# Patient Record
Sex: Male | Born: 1990 | Race: Black or African American | Hispanic: No | Marital: Single | Smoking: Current every day smoker
Health system: Southern US, Community
[De-identification: ages and names within clinical notes are randomized; demographics above are authoritative.]

## PROBLEM LIST (undated history)

## (undated) ENCOUNTER — Ambulatory Visit (HOSPITAL_COMMUNITY): Admission: EM | Payer: Self-pay | Source: Home / Self Care

## (undated) ENCOUNTER — Ambulatory Visit (HOSPITAL_COMMUNITY): Admission: EM | Payer: Self-pay

## (undated) ENCOUNTER — Ambulatory Visit (HOSPITAL_COMMUNITY): Payer: Self-pay | Source: Home / Self Care

## (undated) DIAGNOSIS — E119 Type 2 diabetes mellitus without complications: Secondary | ICD-10-CM

## (undated) DIAGNOSIS — I639 Cerebral infarction, unspecified: Secondary | ICD-10-CM

## (undated) DIAGNOSIS — Q249 Congenital malformation of heart, unspecified: Secondary | ICD-10-CM

## (undated) HISTORY — PX: CARDIAC SURGERY: SHX584

---
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
## 2010-06-05 ENCOUNTER — Emergency Department (HOSPITAL_COMMUNITY): Admission: EM | Admit: 2010-06-05 | Discharge: 2010-06-06 | Payer: Self-pay | Admitting: Emergency Medicine

## 2011-02-21 LAB — RAPID STREP SCREEN (MED CTR MEBANE ONLY): Streptococcus, Group A Screen (Direct): NEGATIVE

## 2011-04-20 NOTE — Discharge Summary (Signed)
NAME:  [name] [name], [name] [name]                  ACCOUNT NO.:  [id number]   MEDICAL RECORD NO.:  [id number]          PATIENT TYPE:  INP   LOCATION:  6121                         FACILITY:  [facility]   PHYSICIAN:  Leighton Roach McDiarmid, M.D.DATE OF BIRTH:  [birth year]   DATE OF ADMISSION:  03/31/2008  DATE OF DISCHARGE:  04/03/2008                               DISCHARGE SUMMARY   REASON FOR ADMISSION:  Right arm weakness and numbness.   DISCHARGE DIAGNOSES:  1. 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  2. Atrial Septal Defect, ostium secondum type  3. Severe attention deficit hyperactivity disorder.   DISCHARGE MEDICATIONS:  1. Aspirin 81 mg p.o. q. day.  2. Clonidine 0.1 mg p.o. nightly.  3. Daytrana 20 mg patch per 9 hours daily.   CONSULTS:  1. Pediatric cardiology:  Dr. Ace Gins of [facility].  2. [place] [facility]:  Dr. Pearlean Brownie and Dr. Sharene Skeans.   PROCEDURES AND STUDIES:  The patient had a CT of the head on March 31, 2008, which revealed no acute intracranial findings demonstrated and no  CT evidence of acute stroke.  An  MRI of the brain and MRA of the brain  on April 01, 2008, revealed an acute subacute infarct involving the  posterior frontal and anterior left parietal cortex on the either side  of the central sulcus and an additional punctate focus of infarction  involving the posterior aspect of the singular gyrus on the left,  minimal sinus disease and cerebellar tonsillar ectopia without Chiari  malformation.  MRA revealed a normal circle of Willis.  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  Carotid Dopplers  revealed 40% to 59% stenosis in the right and left proximal internal  carotid arteries.  The plaque morphology does not support the findings.  The higher velocities may be due to the patient's age.  Vertebral artery  with antegrade flow bilaterally.  Preliminary report on peripheral  Dopplers revealed no evidence of DVT.  Transcranial Dopplers were still  pending at the time of discharge.  EEG on April 01, 2008, revealed a  normal record with the patient awake and asleep.   LABORATORY DATA:  Stroke panel a revealed white blood cell count of 7.1,  hemoglobin 13.4, hematocrit 40.0, MCV 88.6, RDW 13.8, platelet count 222  with a normal differential.  INR of 1.2.  Sodium 140, potassium 3.5,  chloride 109, bicarbonate 25, glucose 91, BUN 9, creatinine 0.72, total  bilirubin 0.4, alkaline phosphatase 212, AST 29, ALT 22, total protein  6.7, albumin 4.0, and calcium 9.0.  CK 462, CK-MB 3.4, troponin-I of  0.01.  Urine  drug screen was negative.  Urinalysis revealed a specific  gravity of 1.030, otherwise was negative.  Lipid profile revealed  cholesterol of 109, triglycerides 72, HDL 37, LDL 58, VLDL 14.  Erythrocyte sedimentation rate was 3.  C3 complement level was 115.  C4  complement level was 22.  Hemoglobin A1c was 5.3.  Homocysteine level  was 5.9.  Hemoglobin electrophoresis revealed 97.1% hemoglobin A and  2.9% hemoglobin A2.  Hypercoagulable panel revealed antithrombin III  enzymatic of 107 which is normal, Protein C total 59, protein C  functional 91, protein S total 91, protein S functional 81, PTT-LA 43.2,  dRVVT 46.7.  Lupus anticoagulant was not detected; it was negative for  prothrombin II gene mutation.  ANA was negative.  There are still more  laboratory studies pending at the time of discharge.   HOSPITAL COURSE:  [name] [name] is a 20 year old African American male who  initially was admitted  complaining of right-handed weakness and numbness  and a right facial weakness and numbness.  Head CT initially did not  show any signs or symptoms of infarct, but the patient continued to  decline of symptoms.  Therefore, a full workup was completed.  MRI of  the brain did in fact show a left-sided infarct which would correlate  with his symptoms as he described.  Hypercoagulability workup was  initiated to try to evaluate reason for the patient to have infarcts.  At the time of this dictation, there was nothing found except for the  secundum ASD as noted above.  The patient did have a few laboratory data  values still pending.  The patient's symptoms improved during his  hospital stay, and he was with full function of the right hand but with  still a minimal amount of clumsiness and some decreased sensation of the  right hand.  PT and OT evaluated the patient during hospital stay and  felt that he was certainly stable for discharge.  He was, however,  provided with a prescription for PT/OT as an outpatient to help with  coordination of his right hand after the stroke.   INSTRUCTIONS TO FOLLOW UP:  The patient is to return to his group home  after discharge.  He has no restriction with regard to his activity.  He  had an appointment scheduled with Dr. Ace Gins of pediatric cardiology,  phone number [phone] on Apr 18, 2008, at 10 a.m.; with Dr. Deirdre Priest of  the [facility], phone number [phone] on Apr 15, 2008, at 4:25 p.m.; with Dr. Pearlean Brownie of [facility], phone number  [phone] on June 03, 2008, at 12 o'clock p.m.  He was instructed to stop  smoking.  It will need to be followed up because the patient can be  fully heparinized for the procedure to close his secundum ASD.  We need  to know if neurology is okay with this.  I have asked Dr. Deirdre Priest to  look into this as I was also unable to contact Dr. Pearlean Brownie prior to the  patient's discharge.       Ancil Boozer, MD  Electronically Signed      Leighton Roach McDiarmid, M.D.  Electronically Signed    SA/MEDQ  D:  04/03/2008  T:  04/04/2008  Job:  [id number]   cc:   Jhonnie Garner. Ace Gins, MD  Pearlean Brownie, M.D.  Pramod P. Pearlean Brownie, MD

## 2011-04-20 NOTE — Procedures (Signed)
EEG:  [id number].   CLINICAL HISTORY:  The patient is a 20 year old with weakness and  aphasia and with history of attention deficit disorder.  He had sudden  onset of right upper extremity weakness, numbness and aphasia, loss of  awareness.  He had been outside running, came in, sat down, was unable  to move or feel his right arm, and felt as if his face is drooping.  The  patient stared.  There was no loss of continence.  Some witnesses  thought that might be seizure activity.  The study is being done to look  for the presence of seizures (780.02).   PROCEDURE:  The tracing was carried out on a 32-channel digital Cadwell  recorder, reformatted into 16-channel montages with one devoted to EKG.  The patient was awake.  Medications include aspirin, clonidine, and  Daytrana.  International October 20 system of lead placement was used.   DESCRIPTION OF FINDINGS:  Dominant frequency is a 9 Hz, 30-40 mcV, alpha  range activity is well modulated and regulated and attenuates partially  with eye opening.   Background activity consists of mixed frequency, theta range activity of  20 mcV.  The patient drifts into natural sleep with vertex sharp waves  and symmetric and synchronous sleep spindles.  There was no focal  slowing.  There was no interictal epileptiform activity in the form of  spikes or sharp waves.  Activating procedures with hyperventilation  caused arousal.  Photic stimulation induced a sustained symmetric  driving response at multiple frequencies.   EKG showed regular sinus rhythm with ventricular response of 60 beats  per minute.   IMPRESSION:  Normal record with the patient awake and asleep.      Deanna Artis. Sharene Skeans, M.D.  Electronically Signed     NFA:OZHY  D:  04/01/2008 18:24:25  T:  04/02/2008 06:31:28  Job #:  [id number]   cc:   Leighton Roach McDiarmid, M.D.

## 2011-04-20 NOTE — Consult Note (Signed)
NAME:  [name], [name]                  ACCOUNT NO.:  [id number]   MEDICAL RECORD NO.:  [id number]          PATIENT TYPE:  OBV   LOCATION:  6121                         FACILITY:  [facility]   PHYSICIAN:  Pramod P. Pearlean Brownie, MD    DATE OF BIRTH:  [birth year]   DATE OF CONSULTATION:  DATE OF DISCHARGE:                                 CONSULTATION   REFERRING PHYSICIAN:  Lanna Poche, MD   REASON FOR REFERRAL:  Code stroke.   HISTORY OF PRESENT ILLNESS:  [name] [name] is a 20 year old African American  male who was playing cards this evening at his group home where he was  found to be staring and unresponsive with eyes open and then he lead to  the right and fell down.  He did not loose consciousness, but  subsequently complained of right-sided weakness.  He was able to give a  history to EMS of right-sided numbness.  He denies any headache.  There  was no witnessed seizure activity.  There was some facial droop and  drooling of saliva from the right side as per an eye witness whom I  spoke to.  He did not have any tonic-clonic activity, loss of  consciousness, tongue bite, or incontinence.  The patient has been doing  well since then, but is complaining of some persistent right-sided  sensory loss and some mild subjective right hand weakness.  He has no  known prior history of stroke, TIA, seizures, syncope, or loss of  consciousness.  No childhood history of febrile seizures or epilepsy.  No family history of strokes or MI at a young age.   PAST MEDICAL HISTORY:  Significant for attention-deficit hyperactivity  disorder.  He has been noncompliant with his medications.  He is in  trouble with the law and is hence in a group home.   SOCIAL HISTORY:  He is smoking.  He had a history of drug abuse in the  past.  Currently, he denies it and he states he does not drink alcohol  either.   HOME MEDICATIONS:  Clonidine and Daytrana.   REVIEW OF SYSTEMS:  Not significant for any recent fever, cough,  chest  pain, diarrhea, or illness.   PHYSICAL EXAM:  GENERAL:  Reveals young African American boy, who is not  in distress.  He is afebrile.  VITAL SIGNS:  Temperature 98.2, pulse rate is 63 per minute and sinus,  blood pressure 113/60, respiratory rate 18 per minute, and sats 98% on  room air.  HEAD:  Nontraumatic.  NECK:  Supple.  There is no bruit.  ENT:  Unremarkable.  CARDIAC:  Regular heart sounds.  LUNGS:  Clear to auscultation.  NEUROLOGICAL:  The patient is awake and alert.  He is oriented to time,  place, and person.  There is no aphagia plus dysarthria.  Movements are  full range with no nystagmus.  Face is symmetric.  Tongue is midline.  Motor system exam reveals no upper extremity drift.  He has mild  subjective weakness in the right grip, but good strength at the elbow  and  shoulders.  Fine finger movements appear to be diminished on the  right.  He does orbit the left over right upper extremity.  Effort is  quite poor.  Finger-to-nose coordination is accurate on the left and he  misses on the right.  There is no lower extremity weakness, drift,  sensory loss, or incoordination.  He splits the midline for touch and  pinprick and complains of right hemibody sensory loss including the  forehead.  He also splits the midline for vibration sensation.  His  vibration sensation is diminished in the right upper extremity, but not  in the lower extremity.   Data revealed, CT scan of  the head noncontrast study, film personally  reviewed, and is unremarkable without acute abnormality.   ADMISSION LABS:  All unremarkable except slightly elevated CK, but the  patient was playing basketball today.  Urine drug screen is pending.   IMPRESSION:  A 20 year old boy with a brief episode of unresponsiveness  followed by some subjective right-sided weakness and sensory loss with  the neurological exam, which has some nonorganic features.  I doubt this  represents a stroke.  Seizure with  possible some postictal weakness is a  possibility.   PLAN:  The patient should be admitted for 23-hour observation, check MRI  scan of the brain as well as an EEG.  Hold off  any definitive treatment  at the present time.  I had a long discussion with the patient's mother  and grandmother regarding his presentation and discussed the reasons for  not giving thrombolysis with TPA because of his minimum symptoms and the  risk of hemorrhage outweighs benefit in his case plus his exam is also  inconsistent with some nonorganic features.  We could check urine drug  screen as well as sickle cell screen.  Call for questions.   Thank you for this referral.           ______________________________  Sunny Schlein. Pearlean Brownie, MD     PPS/MEDQ  D:  03/31/2008  T:  04/01/2008  Job:  [id number]

## 2011-04-20 NOTE — H&P (Signed)
NAME:  [name] [name], [name] [name]                  ACCOUNT NO.:  [id number]   MEDICAL RECORD NO.:  [id number]          PATIENT TYPE:  OBV   LOCATION:  6121                         FACILITY:  [facility]   PHYSICIAN:  Leighton Roach McDiarmid, M.D.DATE OF BIRTH:  December 27, 1990   DATE OF ADMISSION:  03/31/2008  DATE OF DISCHARGE:                              HISTORY & PHYSICAL   CHIEF COMPLAINT:  Weakness and aphasia.   HISTORY OF PRESENT ILLNESS:  This 20 year old now resident of group home  for behavioral issues with past medical history of ADHD, presents to ED  after brief episode of sudden onset of right upper extremity weakness,  numbness, aphagia, and loss of awareness.  He reports that he was  outside running and playing in heat and felt fine.  Came inside, sat  down on a beanbag chair.  When he went to stand up, he noticed that he  could not move or feel his right arm, and he is unable to speak and felt  like his face is drooping.  When this is reported, he did not respond to  questioning and simply stared.  No loss of conscious, shaking activity,  loss of bladder and bowel incontinence.  Code status was called upon his  arrival to the ED.  Within 45 minutes to beginning of episode, he  regained full functioning except for some remaining tingling in his  right upper extremity.  Head CT negative for acute stroke.  The patient  denies any recent headache, photophobia, fevers, or other systemic  symptoms.  He does admit to on and off lower back pain that has been  ongoing for a week.  Denies dysuria or hematuria.  He denies any drug or  EtOH use.  No known family history of sickle cell disease or sickle  trait, although father's side family is unknown.   ALLERGIES:  No known drug allergies.   PAST MEDICAL HISTORY:  Completing one year program at a group home due  to drug use and behavioral issues.  Per his grandmother, he is doing  well.  Also, he has a history of ADHD.   FAMILY HISTORY:  No known history of  sickle cell.   SOCIAL HISTORY:  Currently lives at group home due to behavioral issues.  Grandmother very involved.  He denies any current drug, tobacco, or EtOH  use.   PHYSICAL EXAM:  VITAL SIGNS:  Temperature is 98.2, pulse is 63,  respiratory rate is 18, BP is 113/68, and O2 sat is 100% on room air.  GENERAL:  He is a well-appearing, alert adolescent, in no acute  distress.  LUNGS:  Clear to auscultation.  No crackles, rhonchi, or wheezing.  HEART:  Regular rate and rhythm without murmur.  ABDOMEN:  Bowel sounds positive, soft, nontender, no masses, and no  hepatosplenomegaly.  NEUROLOGICAL:  Cranial nerves II through XII are intact.  Alert and  oriented x3.  Strength 5/5 in upper and lower extremities bilaterally.  Normal reflexes.  Subject to right-handed weakness and sensation  decreased in right hand.   REVIEW OF SYSTEMS:  See HPI.  ASSESSMENT AND PLAN:  This is a 20 year old male with:  1. Weakness in his right side of the body.  Transient ischemic attack      verses seizure disorder verses nonorganic cause.  Resolving.      Consulted Dr. Pearlean Brownie.  Head CT negative and no gross findings of TIA      or stoke.  We will get MRI of the head and EEG in the a.m. per      neuro.  EDS pending.  We will also check hemoglobin,      electrophoresis to rule out sickle cell.  2. Attention deficit disorder with hyperactivity.  We will continue      Daytrana patch.  3. Insomnia.  Continue clonidine 0.1 at bedtime to avoid rebound.   DISPOSITION:  Likely discharge home tomorrow, pending results of the MRI  and EEG.      Ruthe Mannan, M.D.  Electronically Signed      Leighton Roach McDiarmid, M.D.  Electronically Signed    TA/MEDQ  D:  03/31/2008  T:  04/01/2008  Job:  [id number]

## 2011-08-31 LAB — COMPLEMENT, TOTAL: Compl, Total (CH50): 45 U/mL (ref 31–66)

## 2011-08-31 LAB — FACTOR 5 LEIDEN

## 2011-08-31 LAB — COMPREHENSIVE METABOLIC PANEL
ALT: 20
Albumin: 3.4 — ABNORMAL LOW
Alkaline Phosphatase: 196 — ABNORMAL HIGH
Alkaline Phosphatase: 212 — ABNORMAL HIGH
BUN: 9
Calcium: 9
Creatinine, Ser: 0.72
Glucose, Bld: 91
Potassium: 3.8
Sodium: 140
Total Protein: 6.1
Total Protein: 6.7

## 2011-08-31 LAB — BASIC METABOLIC PANEL
BUN: 7
Chloride: 109
Potassium: 3.6

## 2011-08-31 LAB — RAPID URINE DRUG SCREEN, HOSP PERFORMED
Opiates: NOT DETECTED
Tetrahydrocannabinol: NOT DETECTED

## 2011-08-31 LAB — PROTEIN S, TOTAL: Protein S Ag, Total: 91 % (ref 70–140)

## 2011-08-31 LAB — CK TOTAL AND CKMB (NOT AT ARMC)
CK, MB: 3.4
Total CK: 462 — ABNORMAL HIGH

## 2011-08-31 LAB — URINALYSIS, ROUTINE W REFLEX MICROSCOPIC
Bilirubin Urine: NEGATIVE
Glucose, UA: NEGATIVE
Ketones, ur: NEGATIVE
Protein, ur: NEGATIVE

## 2011-08-31 LAB — ANA: Anti Nuclear Antibody(ANA): NEGATIVE

## 2011-08-31 LAB — DIFFERENTIAL
Basophils Relative: 0
Lymphocytes Relative: 27
Lymphs Abs: 1.9
Monocytes Relative: 8
Neutro Abs: 4.6
Neutrophils Relative %: 65

## 2011-08-31 LAB — HOMOCYSTEINE
Homocysteine: 3.6 — ABNORMAL LOW
Homocysteine: 5.9

## 2011-08-31 LAB — CBC
MCHC: 33.6
Platelets: 222
Platelets: 226
RDW: 13.8
RDW: 13.9

## 2011-08-31 LAB — HEMOGLOBINOPATHY EVALUATION
Hemoglobin Other: 0 (ref 0.0–0.0)
Hgb A2 Quant: 2.9
Hgb A: 97.1 %
Hgb S Quant: 0 % (ref 0.0–0.0)

## 2011-08-31 LAB — PROTIME-INR: INR: 1.2

## 2011-08-31 LAB — PROTEIN C, TOTAL: Protein C, Total: 69 % — ABNORMAL LOW (ref 70–140)

## 2011-08-31 LAB — APTT: aPTT: 29

## 2011-08-31 LAB — PROTHROMBIN GENE MUTATION

## 2011-08-31 LAB — BETA-2-GLYCOPROTEIN I ABS, IGG/M/A
Beta-2 Glyco I IgG: 7 U/mL (ref <20)
Beta-2-Glycoprotein I IgM: <4 U/mL (ref <10)

## 2011-08-31 LAB — PROTEIN S ACTIVITY: Protein S Activity: 81 % (ref 69–129)

## 2011-08-31 LAB — LIPID PANEL
HDL: 37
Triglycerides: 72
VLDL: 14

## 2011-08-31 LAB — SEDIMENTATION RATE: Sed Rate: 3

## 2011-08-31 LAB — C3 COMPLEMENT: C3 Complement: 115

## 2011-08-31 LAB — LUPUS ANTICOAGULANT PANEL
DRVVT: 46.7 (ref 36.1–47.0)
Lupus Anticoagulant: NOT DETECTED

## 2013-02-08 ENCOUNTER — Encounter (HOSPITAL_COMMUNITY): Payer: Self-pay | Admitting: Emergency Medicine

## 2013-02-08 ENCOUNTER — Emergency Department (HOSPITAL_COMMUNITY)
Admission: EM | Admit: 2013-02-08 | Discharge: 2013-02-08 | Disposition: A | Discharge status: Home / Self Care | Payer: Self-pay | Attending: Emergency Medicine | Admitting: Emergency Medicine

## 2013-02-08 DIAGNOSIS — I639 Cerebral infarction, unspecified: Secondary | ICD-10-CM | POA: Insufficient documentation

## 2013-02-08 DIAGNOSIS — E1169 Type 2 diabetes mellitus with other specified complication: Secondary | ICD-10-CM | POA: Insufficient documentation

## 2013-02-08 DIAGNOSIS — F10929 Alcohol use, unspecified with intoxication, unspecified: Secondary | ICD-10-CM

## 2013-02-08 DIAGNOSIS — Z8673 Personal history of transient ischemic attack (TIA), and cerebral infarction without residual deficits: Secondary | ICD-10-CM | POA: Insufficient documentation

## 2013-02-08 DIAGNOSIS — Z8679 Personal history of other diseases of the circulatory system: Secondary | ICD-10-CM | POA: Insufficient documentation

## 2013-02-08 DIAGNOSIS — Q249 Congenital malformation of heart, unspecified: Secondary | ICD-10-CM | POA: Insufficient documentation

## 2013-02-08 DIAGNOSIS — Z8659 Personal history of other mental and behavioral disorders: Secondary | ICD-10-CM | POA: Insufficient documentation

## 2013-02-08 DIAGNOSIS — E162 Hypoglycemia, unspecified: Secondary | ICD-10-CM

## 2013-02-08 DIAGNOSIS — E119 Type 2 diabetes mellitus without complications: Secondary | ICD-10-CM | POA: Insufficient documentation

## 2013-02-08 DIAGNOSIS — F1010 Alcohol abuse, uncomplicated: Secondary | ICD-10-CM | POA: Insufficient documentation

## 2013-02-08 DIAGNOSIS — R61 Generalized hyperhidrosis: Secondary | ICD-10-CM | POA: Insufficient documentation

## 2013-02-08 DIAGNOSIS — R5381 Other malaise: Secondary | ICD-10-CM | POA: Insufficient documentation

## 2013-02-08 HISTORY — DX: Type 2 diabetes mellitus without complications: E11.9

## 2013-02-08 HISTORY — DX: Cerebral infarction, unspecified: I63.9

## 2013-02-08 HISTORY — DX: Congenital malformation of heart, unspecified: Q24.9

## 2013-02-08 LAB — COMPREHENSIVE METABOLIC PANEL
ALT: 30 U/L (ref 0–53)
Alkaline Phosphatase: 61 U/L (ref 39–117)
BUN: 16 mg/dL (ref 6–23)
CO2: 24 mEq/L (ref 19–32)
Chloride: 103 mEq/L (ref 96–112)
GFR calc Af Amer: >90 mL/min (ref >90)
GFR calc non Af Amer: >90 mL/min (ref >90)
Glucose, Bld: 103 mg/dL — ABNORMAL HIGH (ref 70–99)
Potassium: 4 mEq/L (ref 3.5–5.1)
Sodium: 141 mEq/L (ref 135–145)
Total Bilirubin: 0.5 mg/dL (ref 0.3–1.2)

## 2013-02-08 LAB — URINALYSIS, ROUTINE W REFLEX MICROSCOPIC
Bilirubin Urine: NEGATIVE
Ketones, ur: 15 mg/dL — AB
Leukocytes, UA: NEGATIVE
Nitrite: NEGATIVE
Protein, ur: NEGATIVE mg/dL
pH: 5 (ref 5.0–8.0)

## 2013-02-08 LAB — CBC
HCT: 43 % (ref 39.0–52.0)
Hemoglobin: 15.3 g/dL (ref 13.0–17.0)
MCHC: 35.6 g/dL (ref 30.0–36.0)
RBC: 4.76 MIL/uL (ref 4.22–5.81)
WBC: 19 10*3/uL — ABNORMAL HIGH (ref 4.0–10.5)

## 2013-02-08 LAB — GLUCOSE, CAPILLARY: Glucose-Capillary: 163 mg/dL — ABNORMAL HIGH (ref 70–99)

## 2013-02-08 NOTE — ED Provider Notes (Signed)
History     CSN: [id number]  Arrival date & time 02/08/13  4098   First MD Initiated Contact with Patient 02/08/13 [phone]      Chief Complaint  Patient presents with  . Hypoglycemia    (Consider location/radiation/quality/duration/timing/severity/associated sxs/prior treatment) HPI [name] [name] is a 22 y.o. male with a past medical history significant for the secundum ASD he is status post CVA 2000 posterior frontal and anterior left parietal cortex leaving him with some mild right-handed clumsiness and weakness, also attention deficit disorder, patient arrives with chief complaint "couldn't move." Patient was drinking alcohol last night and didn't have anything to eat, he woke up this morning sweating, and he felt like he couldn't move. Called EMS, on arrival they found his blood sugar to be 62, he was given glucose and started feeling better immediately.  Denies any chest pain, shortness of breath, dizziness lightheadedness, denies any strokelike symptoms including weakness or numbness or tingling. He has got no dysarthria, no trouble speaking, no slurred words, no word finding ability problems. He denies being diabetic and does not take diabetic medications.   Past Medical History  Diagnosis Date  . Stroke   . Heart defect   . Diabetes     History reviewed. No pertinent past surgical history.  History reviewed. No pertinent family history.  History  Substance Use Topics  . Smoking status: Never Smoker   . Smokeless tobacco: Not on file  . Alcohol Use: No      Review of Systems At least 10pt or greater review of systems completed and are negative except where specified in the HPI.  Allergies  Review of patient's allergies indicates no known allergies.  Home Medications   Current Outpatient Rx  Name  Route  Sig  Dispense  Refill  . cloNIDine (CATAPRES) 0.1 MG tablet   Oral   Take 0.1 mg by mouth 2 (two) times daily.             BP 136/73  Pulse 72  Temp(Src)  96.1 F (35.6 C)  Resp 16  SpO2 100%  Physical Exam  Nursing notes reviewed.  Electronic medical record reviewed. VITAL SIGNS:   Filed Vitals:   02/08/13 0545 02/08/13 0600 02/08/13 0615 02/08/13 0645  BP: 116/77 112/70 111/71 112/69  Pulse: 73 76 66 75  Temp: 96.1 F (35.6 C)     Resp: 20 13 19 21   SpO2: 100% 100% 100% 100%   CONSTITUTIONAL: Awake, oriented, appears non-toxic HENT: Atraumatic, normocephalic, oral mucosa pink and moist, airway patent. Nares patent without drainage. External ears normal. EYES: Conjunctiva clear, EOMI, PERRLA NECK: Trachea midline, non-tender, supple CARDIOVASCULAR: Normal heart rate, Normal rhythm, No murmurs, rubs, gallops PULMONARY/CHEST: Clear to auscultation, no rhonchi, wheezes, or rales. Symmetrical breath sounds. Non-tender. ABDOMINAL: Non-distended, soft, non-tender - no rebound or guarding.  BS normal. NEUROLOGIC: Moving all four extremities, no gross sensory deficits. Patient's strength in the right upper extremity is 4+ out of 5, is only minimally weak when compared with the left side this is mostly in grip strength. His coordination is mildly decreased at the right side but it is very subtle. Patient's lower extremity strength is 5 out of 5 in flexors and extensors bilaterally. EXTREMITIES: No clubbing, cyanosis, or edema SKIN: Warm, Dry, No erythema, No rash  ED Course  Procedures (including critical care time)  Date: 02/13/2013  Rate: 66  Rhythm: sinus rhythm  QRS Axis: Normal  Intervals: First degree AV block, PR interval, QRS  is prolonged  ST/T Wave abnormalities: Diffuse ST elevations in lead 1, 2 V3 V4 V5 and V6-these are seen on prior EKG in 2009 as well - also J-point elevation  Conduction Disutrbances: Incomplete right bundle branch block  Narrative Interpretation: unremarkable compared to prior EKG - no new changes, early repolarization abnormality, nothing to suggest acute ischemia or infarction  Labs Reviewed   GLUCOSE, CAPILLARY - Abnormal; Notable for the following:    Glucose-Capillary 163 (*)    All other components within normal limits  CBC - Abnormal; Notable for the following:    WBC 19.0 (*)    All other components within normal limits  COMPREHENSIVE METABOLIC PANEL - Abnormal; Notable for the following:    Glucose, Bld 103 (*)    AST 40 (*)    All other components within normal limits  URINALYSIS, ROUTINE W REFLEX MICROSCOPIC - Abnormal; Notable for the following:    Glucose, UA 500 (*)    Ketones, ur 15 (*)    All other components within normal limits   No results found.   1. Hypoglycemia   2. Alcohol intoxication       MDM  [name] [name] is a 22 y.o. male with a past medical history in the records is significant for diabetes, patient is not taking any kind of medication at this time. Patient was drinking alcohol last night and says he did not since yesterday around noon. The patient likely had hypoglycemic episode as a result of drinking too much alcohol. Patient's AST is slightly elevated as reflection of this. Patient feels much better and back to baseline after receiving glucose. Patient has been able to eat and drink here in the ER, he's had no symptoms whatsoever. Patient denies any chest pain. EKG is unchanged from prior and 2009. Patient's neurologic exam is unchanged, he says he is at baseline.  I explained the diagnosis and have given explicit precautions to return to the ER including any other new or worsening symptoms. The patient understands and accepts the medical plan as it's been dictated and I have answered their questions. Discharge instructions concerning home care and prescriptions have been given.  The patient is STABLE and is discharged to home in good condition.          Jones Skene, MD 02/13/13 1316

## 2013-02-08 NOTE — ED Notes (Signed)
CBG 163 

## 2013-02-08 NOTE — ED Notes (Signed)
Phlebotomist at bedside; patient given something to eat, per EDP request.

## 2013-02-08 NOTE — ED Notes (Addendum)
Per EMS, patient called out EMS for "not feeling right".  Upon EMS arrival, patient was sluggish to respond upon EMS.  Stated, "I don't feel well".  Initial CBG 43; given D 10; now 208.  Patient refuses to take anything by mouth because he is nauseous (patient reports being nauseous for the last two to three days).  Patient has family history of diabetes on both sides.  Patient given 4 mg Zofran IV; no relief.  Patient does have history of stroke secondary to heart defect.  Right bundle branch block and first degree block on 12-lead. Patient has returned to baseline mental status; alert and oriented x4.

## 2013-03-13 ENCOUNTER — Telehealth (HOSPITAL_COMMUNITY): Payer: Self-pay | Admitting: Emergency Medicine

## 2013-03-13 NOTE — ED Notes (Signed)
Pt lost his DC paperwork.  ID verified.  Code provided to pt

## 2013-09-02 ENCOUNTER — Emergency Department (HOSPITAL_COMMUNITY)
Admission: EM | Admit: 2013-09-02 | Discharge: 2013-09-02 | Disposition: A | Discharge status: Home / Self Care | Payer: Self-pay | Attending: Emergency Medicine | Admitting: Emergency Medicine

## 2013-09-02 ENCOUNTER — Encounter (HOSPITAL_COMMUNITY): Payer: Self-pay | Admitting: *Deleted

## 2013-09-02 ENCOUNTER — Emergency Department (HOSPITAL_COMMUNITY): Payer: Self-pay

## 2013-09-02 DIAGNOSIS — S29011A Strain of muscle and tendon of front wall of thorax, initial encounter: Secondary | ICD-10-CM

## 2013-09-02 DIAGNOSIS — E119 Type 2 diabetes mellitus without complications: Secondary | ICD-10-CM | POA: Insufficient documentation

## 2013-09-02 DIAGNOSIS — IMO0002 Reserved for concepts with insufficient information to code with codable children: Secondary | ICD-10-CM | POA: Insufficient documentation

## 2013-09-02 DIAGNOSIS — X503XXA Overexertion from repetitive movements, initial encounter: Secondary | ICD-10-CM | POA: Insufficient documentation

## 2013-09-02 DIAGNOSIS — Z8673 Personal history of transient ischemic attack (TIA), and cerebral infarction without residual deficits: Secondary | ICD-10-CM | POA: Insufficient documentation

## 2013-09-02 DIAGNOSIS — Z8774 Personal history of (corrected) congenital malformations of heart and circulatory system: Secondary | ICD-10-CM | POA: Insufficient documentation

## 2013-09-02 DIAGNOSIS — Y929 Unspecified place or not applicable: Secondary | ICD-10-CM | POA: Insufficient documentation

## 2013-09-02 DIAGNOSIS — Y9389 Activity, other specified: Secondary | ICD-10-CM | POA: Insufficient documentation

## 2013-09-02 LAB — POCT I-STAT TROPONIN I: Troponin i, poc: 0 ng/mL (ref 0.00–0.08)

## 2013-09-02 MED ORDER — IBUPROFEN 800 MG PO TABS
800.0000 mg | ORAL_TABLET | Freq: Once | ORAL | Status: AC
  Administered 2013-09-02: 800 mg via ORAL
  Filled 2013-09-02: qty 1

## 2013-09-02 MED ORDER — IBUPROFEN 800 MG PO TABS: 800.0000 mg | ORAL_TABLET | Freq: Three times a day (TID) | ORAL | Status: DC

## 2013-09-02 NOTE — ED Notes (Signed)
Pt reports doing heavy lifting, then onset of sharp pains to left rib area, increases with movement and breathing. No acute distress noted.

## 2013-09-02 NOTE — ED Provider Notes (Signed)
CSN: [id number]     Arrival date & time 09/02/13  1036 History   First MD Initiated Contact with Patient 09/02/13 1045     Chief Complaint  Patient presents with  . 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 Patient states the pain is sharp in nature and worsens with movement, such as twisting, and deep breathing. Symptoms have been constant since onset without any alleviating factors. Patient denies taking anything for his pain. Patient denies associated fevers, syncope, numbness or tingling, weakness, nausea or vomiting, and shortness of breath.  The history is provided by the patient. No language interpreter was used.    Past Medical History  Diagnosis Date  . Stroke   . Heart defect   . Diabetes    History reviewed. No pertinent past surgical history. History reviewed. No pertinent family history. History  Substance Use Topics  . Smoking status: Never Smoker   . Smokeless tobacco: Not on file  . Alcohol Use: No    Review of Systems  Constitutional: Negative for fever.  Respiratory: Negative for shortness of breath.   Cardiovascular: Positive for chest pain.  Gastrointestinal: Negative for nausea and vomiting.  Neurological: Negative for syncope and numbness.  All other systems reviewed and are negative.    Allergies  Review of patient's allergies indicates no known allergies.  Home Medications   Current Outpatient Rx  Name  Route  Sig  Dispense  Refill  . ibuprofen (ADVIL,MOTRIN) 800 MG tablet   Oral   Take 1 tablet (800 mg total) by mouth 3 (three) times daily.   21 tablet   0    BP 116/80  Pulse 64  Temp(Src) 99.5 F (37.5 C) (Oral)  Resp 17  SpO2 100%  Physical Exam  Nursing note and vitals reviewed. Constitutional: He is  oriented to person, place, and time. He appears well-developed and well-nourished. No distress.  HENT:  Head: Normocephalic and atraumatic.  Eyes: Conjunctivae and EOM are normal. No scleral icterus.  Neck: Normal range of motion.  Cardiovascular: Normal rate, regular rhythm, normal heart sounds and intact distal pulses.   Pulmonary/Chest: Effort normal and breath sounds normal. No respiratory distress. He has no wheezes. He exhibits no tenderness.  Musculoskeletal: Normal range of motion.  Neurological: He is alert and oriented to person, place, and time.  Skin: Skin is warm and dry. No rash noted. He is not diaphoretic. No erythema. No pallor.  Psychiatric: He has a normal mood and affect. His behavior is normal.    ED Course  Procedures (including critical care time) Labs Review Labs Reviewed  POCT I-STAT TROPONIN I    Imaging Review Dg Chest 2 View  09/02/2013   CLINICAL DATA:  Initial encounter for left chest pain which the patient relates to a pulled muscle while lifting a box earlier today. Current smoker. Prior history of atrial septal defect repair.  EXAM: CHEST  2 VIEW  COMPARISON:  None.  FINDINGS: Cardiac silhouette upper normal in size. Hilar and mediastinal contours unremarkable. ASD repair device noted. Lungs clear. Bronchovascular markings normal. Pulmonary vascularity normal. No visible pleural effusions. No pneumothorax. Visualized bony thorax intact.  IMPRESSION: No acute cardiopulmonary disease.   Electronically Signed   By: Hulan Saas   On: 09/02/2013 11:36    Date: 09/02/2013  Rate: 65  Rhythm: normal  sinus rhythm and sinus arrhythmia  QRS Axis: normal  Intervals: PR prolonged  ST/T Wave abnormalities: normal  Conduction Disutrbances:first-degree A-V block  and nonspecific intraventricular conduction delay  Narrative Interpretation: NSR with sinus arrhythmia, 1st degree AV block and NS IVCD; no STEMI  Old EKG Reviewed: none available I have personally  reviewed and interpreted this EKG   MDM   1. Chest wall muscle strain, initial encounter    22 year old male presents for left-sided chest pain after lifting a dresser. Patient as well and nontoxic appearing, hemodynamically stable, and afebrile. Pain is worse with movement and use of his pectoral muscles. Chest x-ray without evidence of pneumonia, pneumothorax, or pleural effusion. Troponin 0.00 and EKG nonconcerning. Doubt ACS given atypical nature of symptoms and onset after heavy lifting. Symptoms c/w muscle strain of chest wall. Patient appropriate for discharge with primary care followup and instructions to apply ice to the affected area and take ibuprofen for pain control. Return precautions discussed and patient agreeable to plan with no unaddressed concerns.    Antony Madura, PA-C 09/02/13 1324

## 2013-09-04 NOTE — ED Provider Notes (Signed)
Medical screening examination/treatment/procedure(s) were performed by non-physician practitioner and as supervising physician I was immediately available for consultation/collaboration.   Suzi Roots, MD 09/04/13 [phone]

## 2013-10-11 ENCOUNTER — Other Ambulatory Visit: Payer: Self-pay

## 2014-09-30 ENCOUNTER — Emergency Department (HOSPITAL_COMMUNITY)
Admission: EM | Admit: 2014-09-30 | Discharge: 2014-09-30 | Disposition: A | Discharge status: Home / Self Care | Payer: Self-pay | Attending: Emergency Medicine | Admitting: Emergency Medicine

## 2014-09-30 ENCOUNTER — Encounter (HOSPITAL_COMMUNITY): Payer: Self-pay | Admitting: Emergency Medicine

## 2014-09-30 ENCOUNTER — Emergency Department (HOSPITAL_COMMUNITY): Payer: Self-pay

## 2014-09-30 DIAGNOSIS — Z792 Long term (current) use of antibiotics: Secondary | ICD-10-CM | POA: Insufficient documentation

## 2014-09-30 DIAGNOSIS — N451 Epididymitis: Secondary | ICD-10-CM

## 2014-09-30 DIAGNOSIS — N508 Other specified disorders of male genital organs: Secondary | ICD-10-CM | POA: Insufficient documentation

## 2014-09-30 DIAGNOSIS — R011 Cardiac murmur, unspecified: Secondary | ICD-10-CM | POA: Insufficient documentation

## 2014-09-30 DIAGNOSIS — R102 Pelvic and perineal pain: Secondary | ICD-10-CM

## 2014-09-30 DIAGNOSIS — E119 Type 2 diabetes mellitus without complications: Secondary | ICD-10-CM | POA: Insufficient documentation

## 2014-09-30 DIAGNOSIS — Q249 Congenital malformation of heart, unspecified: Secondary | ICD-10-CM | POA: Insufficient documentation

## 2014-09-30 DIAGNOSIS — Z8673 Personal history of transient ischemic attack (TIA), and cerebral infarction without residual deficits: Secondary | ICD-10-CM | POA: Insufficient documentation

## 2014-09-30 DIAGNOSIS — N50811 Right testicular pain: Secondary | ICD-10-CM

## 2014-09-30 LAB — URINALYSIS, ROUTINE W REFLEX MICROSCOPIC
Bilirubin Urine: NEGATIVE
GLUCOSE, UA: NEGATIVE mg/dL
Hgb urine dipstick: NEGATIVE
Ketones, ur: 15 mg/dL — AB
LEUKOCYTES UA: NEGATIVE
NITRITE: NEGATIVE
PROTEIN: NEGATIVE mg/dL
Specific Gravity, Urine: 1.024 (ref 1.005–1.030)
UROBILINOGEN UA: 1 mg/dL (ref 0.0–1.0)
pH: 7.5 (ref 5.0–8.0)

## 2014-09-30 LAB — URINE MICROSCOPIC-ADD ON

## 2014-09-30 LAB — HIV ANTIBODY (ROUTINE TESTING W REFLEX): HIV 1&2 Ab, 4th Generation: NONREACTIVE

## 2014-09-30 MED ORDER — CEFTRIAXONE SODIUM 250 MG IJ SOLR
250.0000 mg | Freq: Once | INTRAMUSCULAR | Status: AC
  Administered 2014-09-30: 250 mg via INTRAMUSCULAR
  Filled 2014-09-30: qty 250

## 2014-09-30 MED ORDER — DOXYCYCLINE HYCLATE 100 MG PO CAPS: 100.0000 mg | ORAL_CAPSULE | Freq: Two times a day (BID) | ORAL | Status: DC

## 2014-09-30 MED ORDER — STERILE WATER FOR INJECTION IJ SOLN
INTRAMUSCULAR | Status: AC
  Administered 2014-09-30: 10 mL
  Filled 2014-09-30: qty 10

## 2014-09-30 NOTE — ED Notes (Signed)
Pt c/o R testicular swelling & pain onset today, denies penile drainage & injury to the area, denies dysuria

## 2014-09-30 NOTE — Discharge Instructions (Signed)
Please call your doctor for a followup appointment within 24-48 hours. When you talk to your doctor please let them know that you were seen in the emergency department and have them acquire all of your records so that they can discuss the findings with you and formulate a treatment plan to fully care for your new and ongoing problems. Please call and set-up an appointment with your primary care provider to be seen and re-assessed Please take antibiotics as prescribed and on a full stomach  Please avoid any sexual activity until seen and re-assessed Please rest and stay hydrated Please have partner(s) checked for STD Please elevated the testicle  Please do not directly place ice on the testicle Please continue to monitor symptoms closely and if symptoms are to worsen or change (fever greater than 101, chills, sweating, nausea, vomiting, chest pain, shortness of breathe, difficulty breathing, weakness, numbness, tingling, worsening or changes to pain pattern, swelling, red streaks running down the leg, penile swelling or discharge, worsening or changes to pain in the testicle, changes to skin color, testicle hot to the touch) please report back to the Emergency Department immediately.    Epididymitis Epididymitis is a swelling (inflammation) of the epididymis. The epididymis is a cord-like structure along the back part of the testicle. Epididymitis is usually, but not always, caused by infection. This is usually a sudden problem beginning with chills, fever and pain behind the scrotum and in the testicle. There may be swelling and redness of the testicle. DIAGNOSIS  Physical examination will reveal a tender, swollen epididymis. Sometimes, cultures are obtained from the urine or from prostate secretions to help find out if there is an infection or if the cause is a different problem. Sometimes, blood work is performed to see if your white blood cell count is elevated and if a germ (bacterial) or viral  infection is present. Using this knowledge, an appropriate medicine which kills germs (antibiotic) can be chosen by your caregiver. A viral infection causing epididymitis will most often go away (resolve) without treatment. HOME CARE INSTRUCTIONS   Hot sitz baths for 20 minutes, 4 times per day, may help relieve pain.  Only take over-the-counter or prescription medicines for pain, discomfort or fever as directed by your caregiver.  Take all medicines, including antibiotics, as directed. Take the antibiotics for the full prescribed length of time even if you are feeling better.  It is very important to keep all follow-up appointments. SEEK IMMEDIATE MEDICAL CARE IF:   You have a fever.  You have pain not relieved with medicines.  You have any worsening of your problems.  Your pain seems to come and go.  You develop pain, redness, and swelling in the scrotum and surrounding areas. MAKE SURE YOU:   Understand these instructions.  Will watch your condition.  Will get help right away if you are not doing well or get worse. Document Released: 11/19/2000 Document Revised: 02/14/2012 Document Reviewed: 10/09/2009 [facility] Patient Information 2015 [place], [place]. This information is not intended to replace advice given to you by your health care provider. Make sure you discuss any questions you have with your health care provider.   Emergency Department Resource Guide 1) Find a Doctor and Pay Out of Pocket Although you won't have to find out who is covered by your insurance plan, it is a good idea to ask around and get recommendations. You will then need to call the office and see if the doctor you have chosen will accept you as  a new patient and what types of options they offer for patients who are self-pay. Some doctors offer discounts or will set up payment plans for their patients who do not have insurance, but you will need to ask so you aren't surprised when you get to your  appointment.  2) Contact Your Local Health Department Not all health departments have doctors that can see patients for sick visits, but many do, so it is worth a call to see if yours does. If you don't know where your local health department is, you can check in your phone book. The CDC also has a tool to help you locate your state's health department, and many state websites also have listings of all of their local health departments.  3) Find a Walk-in Clinic If your illness is not likely to be very severe or complicated, you may want to try a walk in clinic. These are popping up all over the country in pharmacies, drugstores, and shopping centers. They're usually staffed by nurse practitioners or physician assistants that have been trained to treat common illnesses and complaints. They're usually fairly quick and inexpensive. However, if you have serious medical issues or chronic medical problems, these are probably not your best option.  No Primary Care Doctor: - Call Health Connect at  [phone] - they can help you locate a primary care doctor that  accepts your insurance, provides certain services, etc. - Physician Referral Service- [phone]  Chronic Pain Problems: Organization         Address  Phone   Notes  [facility]  [phone] Patients need to be referred by their primary care doctor.   Medication Assistance: Organization         Address  Phone   Notes  [facility] Medication [facility] [address] [place], [place] [address] [phone] --Must be a resident of [facility] [place] -- Must have NO insurance coverage whatsoever (no Medicaid/ Medicare, etc.) -- The pt. MUST have a primary care doctor that directs their care regularly and follows them in the community   MedAssist  [phone]   [employer]  [phone]    Agencies that provide inexpensive medical care: Organization         Address  Phone   Notes  [facility]  [phone]   [facility]    [phone]   [facility] [address] [place], [place] [address] [phone]   [facility] [address], [place] [phone]   Planned Parenthood    [phone]   [facility]    [phone]   [facility]  [address], [place] Phone:  [phone], Fax:  [phone] Hours of Operation:  9 am - 6 pm, M-F.  Also accepts Medicaid/Medicare and self-pay.  [facility]  [address],  Phone: [phone], Fax: [phone]. Hours of Operation:  8:30 am - 5:30 pm, M-F.  Also accepts Medicaid and self-pay.  [facility] [place] [address], [place] Phone: [phone]) [phone]   [facility] [address], [place], [place] [phone], Ext. [phone] Mondays & Thursdays: 7-9 AM.  First 15 patients are seen on a first come, first serve basis.    Medicaid-accepting [facility] Providers:  Organization  Address  Phone   Notes  [facility] [address], [place] [phone] Also accepts self-pay patients.  [facility] [id number] [place], [place]  [phone]   [place], [address], [place] [phone]   [facility] [address], [place] [phone]   Lucianne Lei [address], [place]   [phone] Only accepts [place] Access [place] patients after they have their name applied to their card.   Self-Pay (no insurance) in [facility]:  Organization         Address  Phone   Notes  Sickle Cell Patients, [facility] [place] [phone]   [facility] [place] [phone]   [facility]  [place]  [place], [place], [place] [phone]   [facility]/Dr. Osei-Bonsu  [address] [place] or [address], [place] [phone] Phone number for both [place] and [place] locations is the same.  [facility] [address], [place] [phone]   [facility] [address], [place] or [address] [phone]   [facility] [address] [place] [phone], phone; [phone], fax Sees patients 1st and 3rd Saturday of every month.  Must not qualify for public or private insurance (i.e. Medicaid, Medicare, [place] Health Choice, Veterans' Benefits)  Household income should be no more than 200% of the poverty level The clinic cannot treat you if you are pregnant or think you are pregnant  Sexually transmitted diseases are not treated at the clinic.    Dental Care: Organization         Address  Phone  Notes  [facility] [place] [phone] Accepts children up to age 49 who are enrolled in [place] or [place]; pregnant women with a Medicaid card; and children who have applied for Medicaid or [place] Health Choice, but were declined, whose parents can pay a reduced fee at time of service.  [facility] Department of [facility]  [address], [place] [phone] Accepts children up to age 71 who are enrolled in [place] or [place]; pregnant women with a Medicaid card; and children who have applied for Medicaid or [place] Health Choice, but were declined, whose parents can pay a reduced fee at time of service.  [facility] Access PRO[facility]  [place] [phone] Patients are seen by appointment only. Walk-ins are not accepted. [place] will see patients 31 years of age and  older. Monday - Tuesday (8am-5pm) Most Wednesdays (8:30-5pm) $30 per visit, cash only  [facility] Access PRO[facility]  [address], [facility] [phone] Patients are seen by appointment only. Walk-ins are not accepted. [place] will see patients 8 years of age and older. One Wednesday Evening (Monthly: Volunteer Based).  $30 per visit, cash only  [place]  [phone] for adults; Children under age 39, call Graduate Pediatric Dentistry at [phone]. Children aged 82-14, please call [phone] to request a pediatric application.  Dental services are provided in all areas of dental care including  fillings, crowns and bridges, complete and partial dentures, implants, gum treatment, root canals, and extractions. Preventive care is also provided. Treatment is provided to both adults and children. Patients are selected via a lottery and there is often a waiting list.   [facility] [address] [place]  [phone] [email or web address]   [facility] [address] [place], [place] [phone], Ext. [phone] Second and Fourth Thursday of each month, opens at 6:30 AM; Clinic ends at 9 AM.  Patients are seen on a first-come first-served basis, and a limited number are seen during each clinic.   [facility]  [address] Hillard Danker [place], [place] [phone]   Eligibility Requirements You must have lived in [place], [place], or [place] counties for at least the last three months.   You cannot be eligible for state or federal sponsored [employer], including [employer], [place], or [employer].   You generally cannot be eligible for healthcare insurance through your employer.    How to apply: Eligibility screenings are held every Tuesday and Wednesday afternoon from 1:00 pm until 4:00 pm. You do not need an appointment for the interview!  [facility] [address]  [place], [place]   [place]  [place] Department  [place]  [phone]    Behavioral Health Resources in the Community: Intensive Outpatient Programs Organization         Address  Phone  Notes  [place]. [address] [place], [place] [phone]   [facility] [address] [place], [place]   ADS: Alcohol & Drug Svcs [address] [place], [place]   [place] [address]  [place], [place] or [phone]   Substance Abuse Resources Organization         Address  Phone  Notes  Alcohol and Drug Services  [phone]   [place]  [phone]   The [place]   Chinita Pester  [phone]   Residential & Outpatient Substance Abuse Program  [phone]   Psychological Services Organization         Address  Phone  Notes  [facility] [place]  [place]  [phone]   [place] [address] [place] or [phone]    Mobile Crisis Teams Organization         Address  Phone  Notes  [facility], Mobile Crisis Care Unit  [phone]   [facility]ive Psychotherapeutic Services  [address] [place], [place]   [place] [address] [place] [phone]    Self-Help/Support Groups Organization         Address  Phone             Notes  [place]. of [place] - variety of support groups  [facility] [place] Call for more information  Narcotics Anonymous (NA), [facility] [address], [employer] [place]  2 meetings at this location   Special educational needs teacher         Address  Phone  Notes  [facility]idential Treatment [place],    [place]  [place]  [address], [place] [id number], [place], [place]   [place],  [place] [phone] Admissions: 8am-3pm M-F  Incentives Substance [place] [address],    [place], [place] [phone]   [facility] [address] [place], [place], [place]   [facility] [address],  [place], [place]   Insight Programs - Intensive Outpatient [address], [place], [place]   [facility] ([place].,  [place], [place] [phone] or [phone]   [facility]idential Treatment Services (RTS) [address], [place], [place] Accepts Medicaid  [facility] [place] [address],  [place] [phone] Substance Abuse/Addiction Treatment   [facility] Organization         Address  Phone  Notes  [facility]  [phone]   Domenic Schwab, PhD [address] Arlis Porta [place], [place]   [phone] or [phone]   [place], [place] [phone]   [facility] [address] [place], [place] [phone] Insurance/Medicaid/sponsorship through [facility] and Families [address]                                    [place], [place] [phone] [place] [address], [place] [phone]    Dr. Adele Schilder  [phone]   [facility] Dept. 1) [address], [place] 2) [place] 3)  [place] [address], [place] [phone]  [phone]   [place] [phone] or [phone] (After Hours)

## 2014-09-30 NOTE — ED Provider Notes (Signed)
Medical screening examination/treatment/procedure(s) were performed by non-physician practitioner and as supervising physician I was immediately available for consultation/collaboration.   EKG Interpretation None        Samya Siciliano, MD 09/30/14 1729 

## 2014-09-30 NOTE — ED Provider Notes (Signed)
CSN: [id number]     Arrival date & time 09/30/14  [id number] History   First MD Initiated Contact with Patient 09/30/14 1013     Chief Complaint  Patient presents with  . Testicle Pain     (Consider location/radiation/quality/duration/timing/severity/associated sxs/prior Treatment) The history is provided by the patient. No language interpreter was used.  [name] [name] is a 23 y/o M with PMHx of DM, stroke, heart defects presenting to the ED with right testicular pain that started this morning after sexual intercourse. Patient reported that the pain is localized to the right testicle and stated that the pain is a sharp shooting pain that is intermittent. Patient reported that the right testicle is tender to the touch, reported that he is unable to close his legs secondary to increased pain. Stated that there is swelling to the right testicle as well. Denied radiation of pain. Stated that he is sexually active, does not use protection. Denied redness, hot to the touch, changes to skin color, penile discharge, penile swelling, dysuria, nausea, vomiting, diarrhea, melena, groin pain, fevers or chills. PCP none  Past Medical History  Diagnosis Date  . Stroke   . Heart defect   . Diabetes    History reviewed. No pertinent past surgical history. No family history on file. History  Substance Use Topics  . Smoking status: Never Smoker   . Smokeless tobacco: Not on file  . Alcohol Use: No    Review of Systems  Constitutional: Negative for fever and chills.  Respiratory: Negative for chest tightness and shortness of breath.   Cardiovascular: Negative for chest pain.  Gastrointestinal: Negative for nausea, vomiting, abdominal pain and diarrhea.  Genitourinary: Positive for scrotal swelling and testicular pain. Negative for dysuria, hematuria, discharge, penile swelling, genital sores and penile pain.  Musculoskeletal: Negative for back pain and neck pain.      Allergies  Review of patient's  allergies indicates no known allergies.  Home Medications   Prior to Admission medications   Medication Sig Start Date End Date Taking? Authorizing Provider  doxycycline (VIBRAMYCIN) 100 MG capsule Take 1 capsule (100 mg total) by mouth 2 (two) times daily. One po bid x 7 days 09/30/14   Lawton Dollinger, PA-C   BP 137/66  Pulse 46  Temp(Src) 98.2 F (36.8 C) (Oral)  Resp 16  Ht 6' (1.829 m)  Wt 190 lb (86.183 kg)  BMI 25.76 kg/m2  SpO2 98% Physical Exam  Nursing note and vitals reviewed. Constitutional: He is oriented to person, place, and time. He appears well-developed and well-nourished. No distress.  HENT:  Head: Normocephalic and atraumatic.  Eyes: Conjunctivae and EOM are normal. Right eye exhibits no discharge. Left eye exhibits no discharge.  Neck: Normal range of motion. Neck supple.  Cardiovascular: Normal rate and regular rhythm.   Murmur heard. Cap refill less than 3 seconds  Pulmonary/Chest: Effort normal and breath sounds normal. No respiratory distress. He has no wheezes. He has no rales.  Abdominal: Soft. He exhibits no distension. There is no tenderness. There is no rebound and no guarding.  Negative abdominal distention Bowel sounds normoactive in all 4 quadrants Negative pain upon palpation to the abdomen Abdomen soft Negative guarding rigidity noted Negative peritoneal signs  Genitourinary:  Swelling and mildly high riding testicle identified to the right side with discomfort upon palpation to the anterior and posterior aspect of the right testicle. Negative warmth upon palpation. Negative findings of abscess. Negative palpation of induration or fluctuance. Negative shotty inguinal lymph  nodes noted bilaterally. Negative swelling, erythema, inflammation, lesions, sores, discharge or drainage noted from the penis. Exam chaperoned with nurse  Musculoskeletal: Normal range of motion.  Neurological: He is alert and oriented to person, place, and time. No cranial  nerve deficit. He exhibits normal muscle tone. Coordination normal.  Skin: Skin is warm and dry. No rash noted. He is not diaphoretic. No erythema.  Psychiatric: He has a normal mood and affect. His behavior is normal. Thought content normal.    ED Course  Procedures (including critical care time) Labs Review Labs Reviewed  URINALYSIS, ROUTINE W REFLEX MICROSCOPIC - Abnormal; Notable for the following:    APPearance TURBID (*)    Ketones, ur 15 (*)    All other components within normal limits  URINE MICROSCOPIC-ADD ON - Abnormal; Notable for the following:    Bacteria, UA FEW (*)    All other components within normal limits  GC/CHLAMYDIA PROBE AMP  HIV ANTIBODY (ROUTINE TESTING)    Imaging Review Kore[place] Scrotum  09/30/2014   CLINICAL DATA:  Right scrotal region pain for 7 hr  EXAM: SCROTAL ULTRASOUND  DOPPLER ULTRASOUND OF THE TESTICLES  TECHNIQUE: Complete ultrasound examination of the testicles, epididymis, and other scrotal structures was performed. Color and spectral Doppler ultrasound were also utilized to evaluate blood flow to the testicles.  COMPARISON:  None.  FINDINGS: Right testicle  Measurements: 4.4 x 2.7 x 2.8 cm. No mass or microlithiasis visualized.  Left testicle  Measurements: 4.8 x 2.8 x 3.5 cm. No mass or microlithiasis visualized.  Right epididymis: The right epididymis appears diffusely enlarged and edematous. There is no mass in this area.  Left epididymis:  Normal in size and appearance.  Hydrocele: There is a small hydrocele adjacent to the enlarged right epididymis. No hydrocele on the left.  Varicocele:  None visualized.  No scrotal wall thickening or abscess.  Pulsed Doppler interrogation of both testes demonstrates low resistance arterial and venous waveforms bilaterally. The peak systolic velocity of the right testis is 5.6 cm/sec with an end-diastolic velocity of 1.4 centimeter/second. The peak systolic velocity of the left testis is 4.2 centimeters/second with an  end-diastolic velocity of 1.1 centimeter/second.  IMPRESSION: Evidence of right-sided epididymitis. Small hydrocele on the right. No testicular mass or torsion. Study otherwise unremarkable.   Electronically Signed   By: Bretta BangWilliam  Woodruff M.D.   On: 09/30/2014 11:23   [place] Art/ven Flow Abd Pelv Doppler  09/30/2014   CLINICAL DATA:  Right scrotal region pain for 7 hr  EXAM: SCROTAL ULTRASOUND  DOPPLER ULTRASOUND OF THE TESTICLES  TECHNIQUE: Complete ultrasound examination of the testicles, epididymis, and other scrotal structures was performed. Color and spectral Doppler ultrasound were also utilized to evaluate blood flow to the testicles.  COMPARISON:  None.  FINDINGS: Right testicle  Measurements: 4.4 x 2.7 x 2.8 cm. No mass or microlithiasis visualized.  Left testicle  Measurements: 4.8 x 2.8 x 3.5 cm. No mass or microlithiasis visualized.  Right epididymis: The right epididymis appears diffusely enlarged and edematous. There is no mass in this area.  Left epididymis:  Normal in size and appearance.  Hydrocele: There is a small hydrocele adjacent to the enlarged right epididymis. No hydrocele on the left.  Varicocele:  None visualized.  No scrotal wall thickening or abscess.  Pulsed Doppler interrogation of both testes demonstrates low resistance arterial and venous waveforms bilaterally. The peak systolic velocity of the right testis is 5.6 cm/sec with an end-diastolic velocity of 1.4 centimeter/second. The peak systolic  velocity of the left testis is 4.2 centimeters/second with an end-diastolic velocity of 1.1 centimeter/second.  IMPRESSION: Evidence of right-sided epididymitis. Small hydrocele on the right. No testicular mass or torsion. Study otherwise unremarkable.   Electronically Signed   By: Bretta Bang M.D.   On: 09/30/2014 11:23     EKG Interpretation None      MDM   Final diagnoses:  Testicular pain, right  Pelvic pain in male  Epididymitis, right    Medications  cefTRIAXone  (ROCEPHIN) injection 250 mg (250 mg Intramuscular Given 09/30/14 1246)  sterile water (preservative free) injection (10 mLs  Given 09/30/14 1246)    Filed Vitals:   09/30/14 1005 09/30/14 1015 09/30/14 1205 09/30/14 1300  BP: 121/104 129/74 125/60 137/66  Pulse: 47 43 46 46  Temp: 97.8 F (36.6 C)  98.2 F (36.8 C)   TempSrc: Oral  Oral   Resp: 17  16   Height: 6' (1.829 m)     Weight: 190 lb (86.183 kg)     SpO2: 100% 100% 99% 98%    Scrotal ultrasound and venous Doppler noted evidence of right-sided epididymitis. Small hydrocele of the right side. Negative findings of testicular torsion or mass. Urinalysis negative for nitrites, leukocytes. Negative hemoglobin. Negative findings of urinary tract infection. HIV and GC chlamydia probe pending. Negative findings of testicular torsion or mass. Epididymitis identified to the right side. Patient given ceftriaxone IM. Patient stable, afebrile. Patient not septic appearing. Discharge patient. Discharge patient with antibiotics. Discussed with patient to rest and stay hydrated. Discussed patient to elevate the testicle. Discussed with patient to follow-up with primary care provider. Recommended partner(s) to be assessed. Discussed patient to avoid any sexual activity. Referred to health department. Discussed with patient to closely monitor symptoms and if symptoms are to worsen or change to report back to the ED - strict return instructions given.  Patient agreed to plan of care, understood, all questions answered.   Raymon Mutton, PA-C 09/30/14 1346

## 2014-10-01 LAB — GC/CHLAMYDIA PROBE AMP
CT Probe RNA: NEGATIVE
GC Probe RNA: NEGATIVE

## 2015-04-03 ENCOUNTER — Emergency Department (HOSPITAL_COMMUNITY)
Admission: EM | Admit: 2015-04-03 | Discharge: 2015-04-03 | Disposition: A | Discharge status: Home / Self Care | Payer: Self-pay | Attending: Emergency Medicine | Admitting: Emergency Medicine

## 2015-04-03 ENCOUNTER — Emergency Department (HOSPITAL_COMMUNITY): Payer: Self-pay

## 2015-04-03 ENCOUNTER — Encounter (HOSPITAL_COMMUNITY): Payer: Self-pay | Admitting: Emergency Medicine

## 2015-04-03 DIAGNOSIS — E119 Type 2 diabetes mellitus without complications: Secondary | ICD-10-CM | POA: Insufficient documentation

## 2015-04-03 DIAGNOSIS — Z792 Long term (current) use of antibiotics: Secondary | ICD-10-CM | POA: Insufficient documentation

## 2015-04-03 DIAGNOSIS — Z8673 Personal history of transient ischemic attack (TIA), and cerebral infarction without residual deficits: Secondary | ICD-10-CM | POA: Insufficient documentation

## 2015-04-03 DIAGNOSIS — M7918 Myalgia, other site: Secondary | ICD-10-CM

## 2015-04-03 DIAGNOSIS — Z8774 Personal history of (corrected) congenital malformations of heart and circulatory system: Secondary | ICD-10-CM | POA: Insufficient documentation

## 2015-04-03 DIAGNOSIS — M791 Myalgia: Secondary | ICD-10-CM | POA: Insufficient documentation

## 2015-04-03 LAB — CBC WITH DIFFERENTIAL/PLATELET
BASOS ABS: 0 10*3/uL (ref 0.0–0.1)
BASOS PCT: 0 % (ref 0–1)
EOS PCT: 1 % (ref 0–5)
Eosinophils Absolute: 0.1 10*3/uL (ref 0.0–0.7)
HCT: 43 % (ref 39.0–52.0)
HEMOGLOBIN: 14.5 g/dL (ref 13.0–17.0)
Lymphocytes Relative: 27 % (ref 12–46)
Lymphs Abs: 2.7 10*3/uL (ref 0.7–4.0)
MCH: 31.2 pg (ref 26.0–34.0)
MCHC: 33.7 g/dL (ref 30.0–36.0)
MCV: 92.5 fL (ref 78.0–100.0)
Monocytes Absolute: 0.7 10*3/uL (ref 0.1–1.0)
Monocytes Relative: 7 % (ref 3–12)
Neutro Abs: 6.4 10*3/uL (ref 1.7–7.7)
Neutrophils Relative %: 65 % (ref 43–77)
Platelets: 183 10*3/uL (ref 150–400)
RBC: 4.65 MIL/uL (ref 4.22–5.81)
RDW: 13.1 % (ref 11.5–15.5)
WBC: 10 10*3/uL (ref 4.0–10.5)

## 2015-04-03 LAB — BASIC METABOLIC PANEL
Anion gap: 6 (ref 5–15)
BUN: 12 mg/dL (ref 6–23)
CHLORIDE: 107 mmol/L (ref 96–112)
CO2: 26 mmol/L (ref 19–32)
CREATININE: 0.87 mg/dL (ref 0.50–1.35)
Calcium: 9.3 mg/dL (ref 8.4–10.5)
GFR calc non Af Amer: >90 mL/min (ref >90)
Glucose, Bld: 66 mg/dL — ABNORMAL LOW (ref 70–99)
Potassium: 3.8 mmol/L (ref 3.5–5.1)
Sodium: 139 mmol/L (ref 135–145)

## 2015-04-03 LAB — I-STAT TROPONIN, ED: Troponin i, poc: 0 ng/mL (ref 0.00–0.08)

## 2015-04-03 NOTE — Discharge Instructions (Signed)
Use motrin as directed Chest Wall Pain Chest wall pain is pain in or around the bones and muscles of your chest. It may take up to 6 weeks to get better. It may take longer if you must stay physically active in your work and activities.  CAUSES  Chest wall pain may happen on its own. However, it may be caused by:  A viral illness like the flu.  Injury.  Coughing.  Exercise.  Arthritis.  Fibromyalgia.  Shingles. HOME CARE INSTRUCTIONS   Avoid overtiring physical activity. Try not to strain or perform activities that cause pain. This includes any activities using your chest or your abdominal and side muscles, especially if heavy weights are used.  Put ice on the sore area.  Put ice in a plastic bag.  Place a towel between your skin and the bag.  Leave the ice on for 15-20 minutes per hour while awake for the first 2 days.  Only take over-the-counter or prescription medicines for pain, discomfort, or fever as directed by your caregiver. SEEK IMMEDIATE MEDICAL CARE IF:   Your pain increases, or you are very uncomfortable.  You have a fever.  Your chest pain becomes worse.  You have new, unexplained symptoms.  You have nausea or vomiting.  You feel sweaty or lightheaded.  You have a cough with phlegm (sputum), or you cough up blood. MAKE SURE YOU:   Understand these instructions.  Will watch your condition.  Will get help right away if you are not doing well or get worse. Document Released: 11/22/2005 Document Revised: 02/14/2012 Document Reviewed: 07/19/2011 [facility] Patient Information 2015 [place], [place]. This information is not intended to replace advice given to you by your health care provider. Make sure you discuss any questions you have with your health care provider.

## 2015-04-03 NOTE — ED Notes (Signed)
Pt sts mid sternal CP intermittent in nature that is now resolved

## 2015-04-03 NOTE — ED Provider Notes (Signed)
CSN: [id number]     Arrival date & time 04/03/15  1437 History   First MD Initiated Contact with Patient 04/03/15 1756     Chief Complaint  Patient presents with  . Chest Pain     (Consider location/radiation/quality/duration/timing/severity/associated sxs/prior Treatment) HPI Comments: Patient here complaining of six-month history of left-sided neck and arm pain radiating to his chest that is worse with certain positions. No associated diaphoresis, dyspnea, nausea vomiting. Patient is right-handed. No noted discoloration to his left hand. No anginal type quality to his symptoms. Has not used any medications for this. Patient's symptoms have been persistent and the quality has not changed. Does have a history of ASD repair in the past. Denies any stroke symptoms.  Patient is a 24 y.o. male presenting with chest pain. The history is provided by the patient.  Chest Pain   Past Medical History  Diagnosis Date  . Stroke   . Heart defect   . Diabetes    History reviewed. No pertinent past surgical history. History reviewed. No pertinent family history. History  Substance Use Topics  . Smoking status: Never Smoker   . Smokeless tobacco: Not on file  . Alcohol Use: No    Review of Systems  Cardiovascular: Positive for chest pain.  All other systems reviewed and are negative.     Allergies  Review of patient's allergies indicates no known allergies.  Home Medications   Prior to Admission medications   Medication Sig Start Date End Date Taking? Authorizing Provider  doxycycline (VIBRAMYCIN) 100 MG capsule Take 1 capsule (100 mg total) by mouth 2 (two) times daily. One po bid x 7 days 09/30/14   Marissa Sciacca, PA-C   BP 137/88 mmHg  Pulse 56  Temp(Src) 99 F (37.2 C) (Oral)  Resp 16  SpO2 100% Physical Exam  Constitutional: He is oriented to person, place, and time. He appears well-developed and well-nourished.  Non-toxic appearance. No distress.  HENT:  Head:  Normocephalic and atraumatic.  Eyes: Conjunctivae, EOM and lids are normal. Pupils are equal, round, and reactive to light.  Neck: Normal range of motion. Neck supple. No tracheal deviation present. No thyroid mass present.  Cardiovascular: Normal rate, regular rhythm and normal heart sounds.  Exam reveals no gallop.   No murmur heard. Pulmonary/Chest: Effort normal and breath sounds normal. No stridor. No respiratory distress. He has no decreased breath sounds. He has no wheezes. He has no rhonchi. He has no rales.  Abdominal: Soft. Normal appearance and bowel sounds are normal. He exhibits no distension. There is no tenderness. There is no rebound and no CVA tenderness.  Musculoskeletal: Normal range of motion. He exhibits no edema or tenderness.  Neurological: He is alert and oriented to person, place, and time. He has normal strength. No cranial nerve deficit or sensory deficit. GCS eye subscore is 4. GCS verbal subscore is 5. GCS motor subscore is 6.  Skin: Skin is warm and dry. No abrasion and no rash noted.  Psychiatric: He has a normal mood and affect. His speech is normal and behavior is normal.  Nursing note and vitals reviewed.   ED Course  Procedures (including critical care time) Labs Review Labs Reviewed  BASIC METABOLIC PANEL - Abnormal; Notable for the following:    Glucose, Bld 66 (*)    All other components within normal limits  CBC WITH DIFFERENTIAL/PLATELET  Rosezena SensorI-STAT TROPOININ, ED    Imaging Review Dg Chest 2 View  04/03/2015   CLINICAL DATA:  Intermittent  mid sternal chest pain  EXAM: CHEST  2 VIEW  COMPARISON:  September 02, 2013  FINDINGS: Lungs are clear. Heart size and pulmonary vascularity are normal. No adenopathy. No pneumothorax. No bone lesions. Patient has had a previous atrial septal defect repair.  IMPRESSION: No edema or consolidation.   Electronically Signed   By: Bretta Bang III M.D.   On: 04/03/2015 15:50     EKG Interpretation   Date/Time:   Thursday April 03 2015 14:43:48 EDT Ventricular Rate:  75 PR Interval:  204 QRS Duration: 112 QT Interval:  368 QTC Calculation: 410 R Axis:   -5 Text Interpretation:  Normal sinus rhythm RSR' or QR pattern in V1  suggests right ventricular conduction delay Early repolarization  Borderline ECG No significant change since last tracing Confirmed by Zakar Brosch   MD, Talayla Doyel ([id number]) on 04/03/2015 5:56:49 PM      MDM   Final diagnoses:  None    Patient's EKG is unchanged. Chest x-ray and labs are reassuring. Do not think that this represents ACS or PE. Does not have a murmur on auscultation. Likely musculoskeletal in nature and stable for discharge    Lorre Nick, MD 04/03/15 1805

## 2015-06-02 ENCOUNTER — Other Ambulatory Visit: Payer: Self-pay

## 2015-09-06 ENCOUNTER — Emergency Department (HOSPITAL_COMMUNITY)
Admission: EM | Admit: 2015-09-06 | Discharge: 2015-09-06 | Disposition: A | Discharge status: Home / Self Care | Payer: Self-pay | Attending: Emergency Medicine | Admitting: Emergency Medicine

## 2015-09-06 ENCOUNTER — Emergency Department (HOSPITAL_COMMUNITY): Payer: Self-pay

## 2015-09-06 ENCOUNTER — Encounter (HOSPITAL_COMMUNITY): Payer: Self-pay

## 2015-09-06 DIAGNOSIS — Y9289 Other specified places as the place of occurrence of the external cause: Secondary | ICD-10-CM | POA: Insufficient documentation

## 2015-09-06 DIAGNOSIS — Z23 Encounter for immunization: Secondary | ICD-10-CM | POA: Insufficient documentation

## 2015-09-06 DIAGNOSIS — Z8673 Personal history of transient ischemic attack (TIA), and cerebral infarction without residual deficits: Secondary | ICD-10-CM | POA: Insufficient documentation

## 2015-09-06 DIAGNOSIS — Y998 Other external cause status: Secondary | ICD-10-CM | POA: Insufficient documentation

## 2015-09-06 DIAGNOSIS — W540XXA Bitten by dog, initial encounter: Secondary | ICD-10-CM | POA: Insufficient documentation

## 2015-09-06 DIAGNOSIS — S91331A Puncture wound without foreign body, right foot, initial encounter: Secondary | ICD-10-CM | POA: Insufficient documentation

## 2015-09-06 DIAGNOSIS — Y9389 Activity, other specified: Secondary | ICD-10-CM | POA: Insufficient documentation

## 2015-09-06 DIAGNOSIS — E119 Type 2 diabetes mellitus without complications: Secondary | ICD-10-CM | POA: Insufficient documentation

## 2015-09-06 DIAGNOSIS — Z720 Tobacco use: Secondary | ICD-10-CM | POA: Insufficient documentation

## 2015-09-06 MED ORDER — TETANUS-DIPHTH-ACELL PERTUSSIS 5-2.5-18.5 LF-MCG/0.5 IM SUSP
0.5000 mL | Freq: Once | INTRAMUSCULAR | Status: AC
  Administered 2015-09-06: 0.5 mL via INTRAMUSCULAR
  Filled 2015-09-06: qty 0.5

## 2015-09-06 MED ORDER — AMOXICILLIN-POT CLAVULANATE 875-125 MG PO TABS: 1.0000 | ORAL_TABLET | Freq: Two times a day (BID) | ORAL | Status: DC

## 2015-09-06 NOTE — ED Provider Notes (Signed)
CSN: [id number]     Arrival date & time 09/06/15  1301 History   First MD Initiated Contact with Patient 09/06/15 1308     Chief Complaint  Patient presents with  . Animal Bite     (Consider location/radiation/quality/duration/timing/severity/associated sxs/prior Treatment) HPI [name] [name] is a 24 y.o. male presents to ED with complaint of dog bite. Pt states his friends pit bull accidentally bit him on the right foot. Reports several puncture marks tot he foot denies any other injuries. Dogs vaccines are unknown but he states he knows who dog belongs to. His tetanus is also unknown. Denies numbness or weakness distal to injury. No tx prior to coming in.   Past Medical History  Diagnosis Date  . Stroke (HCC)   . Heart defect   . Diabetes [facility])    Past Surgical History  Procedure Laterality Date  . Cardiac surgery     History reviewed. No pertinent family history. Social History  Substance Use Topics  . Smoking status: Current Every Day Smoker -- 0.50 packs/day    Types: Cigarettes  . Smokeless tobacco: None  . Alcohol Use: No    Review of Systems  Constitutional: Negative for fever and chills.  Respiratory: Negative for cough, chest tightness and shortness of breath.   Cardiovascular: Negative for chest pain, palpitations and leg swelling.  Musculoskeletal: Negative for myalgias, arthralgias, neck pain and neck stiffness.  Skin: Positive for wound. Negative for rash.  Allergic/Immunologic: Negative for immunocompromised state.  Neurological: Negative for weakness and numbness.      Allergies  Review of patient's allergies indicates no known allergies.  Home Medications   Prior to Admission medications   Medication Sig Start Date End Date Taking? Authorizing Provider  doxycycline (VIBRAMYCIN) 100 MG capsule Take 1 capsule (100 mg total) by mouth 2 (two) times daily. One po bid x 7 days 09/30/14   Marissa Sciacca, PA-C   BP 92/51 mmHg  Pulse 85  Temp(Src) 98.3 F  (36.8 C) (Oral)  Resp 20  SpO2 100% Physical Exam  Constitutional: He appears well-developed and well-nourished. No distress.  HENT:  Head: Normocephalic and atraumatic.  Eyes: Conjunctivae are normal.  Neck: Neck supple.  Cardiovascular: Normal rate, regular rhythm and normal heart sounds.   Pulmonary/Chest: Effort normal. No respiratory distress. He has no wheezes. He has no rales.  Musculoskeletal: He exhibits no edema.  Neurological: He is alert.  Skin: Skin is warm and dry.  Two puncture wounds to the sole of the foot just proximal to the 3rd and 4th mtp joints. Full rom of all toes. Cap refill less than 2 sec distally  Nursing note and vitals reviewed.   ED Course  Procedures (including critical care time) Labs Review Labs Reviewed - No data to display  Imaging Review Dg Foot Complete Right  09/06/2015   CLINICAL DATA:  Pain following dog bite  EXAM: RIGHT FOOT COMPLETE - 3+ VIEW  COMPARISON:  None.  FINDINGS: Frontal, oblique, and lateral views were obtained. No fracture or dislocation. Joint spaces appear intact. No erosive change or bony destruction. No soft tissue air. No radiopaque foreign body.  IMPRESSION: No abnormality noted.   Electronically Signed   By: Bretta Bang III M.D.   On: 09/06/2015 14:00   I have personally reviewed and evaluated these images and lab results as part of my medical decision-making.   EKG Interpretation None      MDM   Final diagnoses:  Dog bite  Patient with 2 puncture wounds to the right foot, from a dog bite. Soaked with soap and water, iodine. I scrubbed his foot for 5 minutes with surgical scrub brush with iodine. Irrigated the wounds thoroughly. X-ray obtained and is negative. Home with Augmentin, follow-up as needed, return precautions discussed.  Filed Vitals:   09/06/15 1308 09/06/15 1341  BP: 92/51 150/69  Pulse: 85 77  Temp: 98.3 F (36.8 C)   TempSrc: Oral   Resp: 20   SpO2: 100% 100%       Jaynie Crumble, PA-C 09/06/15 2030  Melene Plan, DO 09/07/15 [phone]

## 2015-09-06 NOTE — Discharge Instructions (Signed)
Keep wounds clean and dry. Bacitracin twice a day. Take augmentin as prescribed until all gone. Follow up with primary care doctor. Return if any signs of infection, swelling, drainage.    Animal Bite An animal bite can result in a scratch on the skin, deep open cut, puncture of the skin, crush injury, or tearing away of the skin or a body part. Dogs are responsible for most animal bites. Children are bitten more often than adults. An animal bite can range from very mild to more serious. A small bite from your house pet is no cause for alarm. However, some animal bites can become infected or injure a bone or other tissue. You must seek medical care if:  The skin is broken and bleeding does not slow down or stop after 15 minutes.  The puncture is deep and difficult to clean (such as a cat bite).  Pain, warmth, redness, or pus develops around the wound.  The bite is from a stray animal or rodent. There may be a risk of rabies infection.  The bite is from a snake, raccoon, skunk, fox, coyote, or bat. There may be a risk of rabies infection.  The person bitten has a chronic illness such as diabetes, liver disease, or cancer, or the person takes medicine that lowers the immune system.  There is concern about the location and severity of the bite. It is important to clean and protect an animal bite wound right away to prevent infection. Follow these steps:  Clean the wound with plenty of water and soap.  Apply an antibiotic cream.  Apply gentle pressure over the wound with a clean towel or gauze to slow or stop bleeding.  Elevate the affected area above the heart to help stop any bleeding.  Seek medical care. Getting medical care within 8 hours of the animal bite leads to the best possible outcome. DIAGNOSIS  Your caregiver will most likely:  Take a detailed history of the animal and the bite injury.  Perform a wound exam.  Take your medical history. Blood tests or X-rays may be  performed. Sometimes, infected bite wounds are cultured and sent to a lab to identify the infectious bacteria.  TREATMENT  Medical treatment will depend on the location and type of animal bite as well as the patient's medical history. Treatment may include:  Wound care, such as cleaning and flushing the wound with saline solution, bandaging, and elevating the affected area.  Antibiotics.  Tetanus immunization.  Rabies immunization.  Leaving the wound open to heal. This is often done with animal bites, due to the high risk of infection. However, in certain cases, wound closure with stitches, wound adhesive, skin adhesive strips, or staples may be used. Infected bites that are left untreated may require intravenous (IV) antibiotics and surgical treatment in the hospital. HOME CARE INSTRUCTIONS  Follow your caregiver's instructions for wound care.  Take all medicines as directed.  If your caregiver prescribes antibiotics, take them as directed. Finish them even if you start to feel better.  Follow up with your caregiver for further exams or immunizations as directed. You may need a tetanus shot if:  You cannot remember when you had your last tetanus shot.  You have never had a tetanus shot.  The injury broke your skin. If you get a tetanus shot, your arm may swell, get red, and feel warm to the touch. This is common and not a problem. If you need a tetanus shot and you choose not  to have one, there is a rare chance of getting tetanus. Sickness from tetanus can be serious. SEEK MEDICAL CARE IF:  You notice warmth, redness, soreness, swelling, pus discharge, or a bad smell coming from the wound.  You have a red line on the skin coming from the wound.  You have a fever, chills, or a general ill feeling.  You have nausea or vomiting.  You have continued or worsening pain.  You have trouble moving the injured part.  You have other questions or concerns. MAKE SURE  YOU:  Understand these instructions.  Will watch your condition.  Will get help right away if you are not doing well or get worse. Document Released: 08/10/2011 Document Revised: 02/14/2012 Document Reviewed: 08/10/2011 [facility] Patient Information 2015 [place], [place]. This information is not intended to replace advice given to you by your health care provider. Make sure you discuss any questions you have with your health care provider.

## 2015-09-06 NOTE — ED Notes (Signed)
Animal bite report filled out by registration clerk.

## 2015-09-06 NOTE — ED Notes (Signed)
Onset 20 min PTA pt was bit by pitbull dog.  Dogs vaccines not up to date.  Teeth mark x 2 on bottom of right foot near 3rd and 4th toe.  Bleeding controlled.

## 2015-11-11 ENCOUNTER — Emergency Department (HOSPITAL_COMMUNITY): Payer: Self-pay

## 2015-11-11 ENCOUNTER — Other Ambulatory Visit: Payer: Self-pay

## 2015-11-11 ENCOUNTER — Emergency Department (HOSPITAL_COMMUNITY)
Admission: EM | Admit: 2015-11-11 | Discharge: 2015-11-12 | Disposition: A | Discharge status: Home / Self Care | Payer: Self-pay | Attending: Emergency Medicine | Admitting: Emergency Medicine

## 2015-11-11 ENCOUNTER — Encounter (HOSPITAL_COMMUNITY): Payer: Self-pay | Admitting: Emergency Medicine

## 2015-11-11 DIAGNOSIS — Z9889 Other specified postprocedural states: Secondary | ICD-10-CM | POA: Insufficient documentation

## 2015-11-11 DIAGNOSIS — R1031 Right lower quadrant pain: Secondary | ICD-10-CM | POA: Insufficient documentation

## 2015-11-11 DIAGNOSIS — R51 Headache: Secondary | ICD-10-CM | POA: Insufficient documentation

## 2015-11-11 DIAGNOSIS — R079 Chest pain, unspecified: Secondary | ICD-10-CM | POA: Insufficient documentation

## 2015-11-11 DIAGNOSIS — F17210 Nicotine dependence, cigarettes, uncomplicated: Secondary | ICD-10-CM | POA: Insufficient documentation

## 2015-11-11 DIAGNOSIS — Z8673 Personal history of transient ischemic attack (TIA), and cerebral infarction without residual deficits: Secondary | ICD-10-CM | POA: Insufficient documentation

## 2015-11-11 DIAGNOSIS — E119 Type 2 diabetes mellitus without complications: Secondary | ICD-10-CM | POA: Insufficient documentation

## 2015-11-11 DIAGNOSIS — R197 Diarrhea, unspecified: Secondary | ICD-10-CM | POA: Insufficient documentation

## 2015-11-11 LAB — BASIC METABOLIC PANEL
Anion gap: 7 (ref 5–15)
BUN: 13 mg/dL (ref 6–20)
CHLORIDE: 105 mmol/L (ref 101–111)
CO2: 27 mmol/L (ref 22–32)
CREATININE: 1.01 mg/dL (ref 0.61–1.24)
Calcium: 9.1 mg/dL (ref 8.9–10.3)
GFR calc Af Amer: >60 mL/min (ref >60)
GFR calc non Af Amer: >60 mL/min (ref >60)
Glucose, Bld: 86 mg/dL (ref 65–99)
Potassium: 3.6 mmol/L (ref 3.5–5.1)
SODIUM: 139 mmol/L (ref 135–145)

## 2015-11-11 LAB — I-STAT TROPONIN, ED: Troponin i, poc: 0 ng/mL (ref 0.00–0.08)

## 2015-11-11 LAB — CBC
HCT: 42.5 % (ref 39.0–52.0)
Hemoglobin: 14.5 g/dL (ref 13.0–17.0)
MCH: 31.3 pg (ref 26.0–34.0)
MCHC: 34.1 g/dL (ref 30.0–36.0)
MCV: 91.6 fL (ref 78.0–100.0)
Platelets: 196 10*3/uL (ref 150–400)
RBC: 4.64 MIL/uL (ref 4.22–5.81)
RDW: 12.8 % (ref 11.5–15.5)
WBC: 10.5 10*3/uL (ref 4.0–10.5)

## 2015-11-11 MED ORDER — IOHEXOL 300 MG/ML  SOLN
25.0000 mL | Freq: Once | INTRAMUSCULAR | Status: AC | PRN
  Administered 2015-11-12: 50 mL via ORAL

## 2015-11-11 MED ORDER — IOHEXOL 300 MG/ML  SOLN
100.0000 mL | Freq: Once | INTRAMUSCULAR | Status: AC | PRN
  Administered 2015-11-12: 100 mL via INTRAVENOUS

## 2015-11-11 NOTE — ED Provider Notes (Signed)
CSN: [id number]     Arrival date & time 11/11/15  2110 History   First MD Initiated Contact with Patient 11/11/15 2154     Chief Complaint  Patient presents with  . Abdominal Pain     (Consider location/radiation/quality/duration/timing/severity/associated sxs/prior Treatment) HPI   [name] [name] is a 24 y.o. male  with a PMH of stoke at the age of 34, ASD with repair who presents to the Emergency Department complaining of acute onset, sharp worsening RLQ abdominal pain beginning this morning with associated symptoms including diarrhea. Pt. Also admits to right sided chest pain, worse with breathing and certain movements. No medications taken PTA, no alleviating factors noted. Denies n/v; + subjective fever.    Past Medical History  Diagnosis Date  . Stroke (HCC)   . Heart defect   . Diabetes [facility])    Past Surgical History  Procedure Laterality Date  . Cardiac surgery     No family history on file. Social History  Substance Use Topics  . Smoking status: Current Every Day Smoker -- 0.50 packs/day    Types: Cigarettes  . Smokeless tobacco: None  . Alcohol Use: No    Review of Systems  Constitutional: Negative.   HENT: Negative for congestion, rhinorrhea and sore throat.   Eyes: Negative for visual disturbance.  Respiratory: Negative for cough, shortness of breath and wheezing.   Cardiovascular: Positive for chest pain. Negative for palpitations and leg swelling.  Gastrointestinal: Positive for abdominal pain and diarrhea. Negative for nausea, vomiting and constipation.  Endocrine: Negative for polydipsia and polyuria.  Musculoskeletal: Negative for myalgias, back pain, arthralgias and neck pain.  Skin: Negative for rash.  Neurological: Positive for headaches. Negative for dizziness and weakness.      Allergies  Review of patient's allergies indicates no known allergies.  Home Medications   Prior to Admission medications   Medication Sig Start Date End Date Taking?  Authorizing Provider  amoxicillin-clavulanate (AUGMENTIN) 875-125 MG tablet Take 1 tablet by mouth 2 (two) times daily. Patient not taking: Reported on 11/11/2015 09/06/15   Tatyana Kirichenko, PA-C  doxycycline (VIBRAMYCIN) 100 MG capsule Take 1 capsule (100 mg total) by mouth 2 (two) times daily. One po bid x 7 days Patient not taking: Reported on 11/11/2015 09/30/14   Marissa Sciacca, PA-C   BP 124/79 mmHg  Pulse 79  Temp(Src) 99.5 F (37.5 C) (Oral)  Resp 16  Ht 6' (1.829 m)  Wt 86.183 kg  BMI 25.76 kg/m2  SpO2 99% Physical Exam  Constitutional: He is oriented to person, place, and time. He appears well-developed and well-nourished.  Alert and in no acute distress  HENT:  Head: Normocephalic and atraumatic.  Cardiovascular: Normal rate, regular rhythm, normal heart sounds and intact distal pulses.  Exam reveals no gallop and no friction rub.   No murmur heard. Pulmonary/Chest: Effort normal and breath sounds normal. No respiratory distress. He has no wheezes. He has no rales. He exhibits tenderness.  Abdominal: He exhibits no mass. There is tenderness. There is tenderness at McBurney's point. There is no rebound and no guarding.    Abdomen soft, non-distended TTP as depicted in image.  Bowel sounds positive in all four quadrants  Musculoskeletal: He exhibits no edema.  Lymphadenopathy:    He has no cervical adenopathy.  Neurological: He is alert and oriented to person, place, and time. No cranial nerve deficit.  Skin: Skin is warm and dry. No rash noted.  Psychiatric: He has a normal mood and affect. His  behavior is normal. Judgment and thought content normal.  Nursing note and vitals reviewed.   ED Course  Procedures (including critical care time) Labs Review Labs Reviewed  BASIC METABOLIC PANEL  CBC  I-STAT TROPOININ, ED    Imaging Review Dg Chest 2 View  11/12/2015  CLINICAL DATA:  Acute onset chest pain 24 hours ago. Previous atrial septal defect repair. EXAM:  CHEST  2 VIEW COMPARISON:  04/03/2015 FINDINGS: The heart size and mediastinal contours are within normal limits. Umbrella device seen consistent with previous atrial septal defect repair. Both lungs are clear. No evidence of pneumothorax or pleural effusion. The visualized skeletal structures are unremarkable. IMPRESSION: No active cardiopulmonary disease. Electronically Signed   By: Myles RosenthalJohn  Stahl M.D.   On: 11/12/2015 00:06   Ct Abdomen Pelvis W Contrast  11/12/2015  CLINICAL DATA:  24 year old male with right lower quadrant abdominal pain. EXAM: CT ABDOMEN AND PELVIS WITH CONTRAST TECHNIQUE: Multidetector CT imaging of the abdomen and pelvis was performed using the standard protocol following bolus administration of intravenous contrast. CONTRAST:  100mL OMNIPAQUE IOHEXOL 300 MG/ML SOLN, 50mL OMNIPAQUE IOHEXOL 300 MG/ML SOLN COMPARISON:  None. FINDINGS: The visualized lung bases are clear. No intra-abdominal free air. Trace free fluid noted within the pelvis. The liver, gallbladder, pancreas, spleen, adrenal glands, kidneys, visualized ureters, and urinary bladder appear unremarkable. The prostate and seminal vesicles are grossly unremarkable. There is no evidence of bowel obstruction . Loose stool noted in the proximal colon. There is mild diffuse stranding of the mesentery fat with mild prominence of the mesentery vasculature. Multiple top-normal mesenteric lymph nodes noted. These findings are nonspecific, however can inflammatory process involving the small bowel is not excluded. Clinical correlation is recommended. The appendix is unremarkable. The abdominal aorta and IVC appear patent. No portal venous gas identified. There is no adenopathy. There is a small fat containing umbilical hernia. The abdominal wall soft tissues are otherwise unremarkable. Left femoral neck bone cyst. The osseous structures are otherwise intact. IMPRESSION: Mild diffuse stranding of the mesentery fat with slight prominence of the  mesentery vasculature. Clinical correlation is recommended to evaluate for enteritis. No bowel obstruction. Normal appendix. Electronically Signed   By: Elgie CollardArash  Radparvar M.D.   On: 11/12/2015 00:45   I have personally reviewed and evaluated these images and lab results as part of my medical decision-making.   EKG Interpretation None      MDM   Final diagnoses:  Right lower quadrant abdominal pain  Nonspecific chest pain  [name] [name] presents with RLQ abdominal pain and diarrhea - TTP at McBurney's - will CT to r/o appendicitis.  Also complaining of chest pain - worse with breathing, movements - trop negative, HEART score of 1 - cardiac etiology unlikely.  PERC negative. CXR shows no acute cardiopulm dz  CT shows mild diffuse stranding of mesentery fat; likely gastroenteritis -- will dc to home in good and stable condition with symptomatic care instructions. Return precautions given, all questions answered.   Blood pressure 124/79, pulse 79, temperature 99.5 F (37.5 C), temperature source Oral, resp. rate 16, SpO2 99 %.  Patient discussed with Dr. Fredderick PhenixBelfi who agrees with treatment plan.   [facility] Area Memorial HospitalJaime Pilcher TRUE Garciamartinez, PA-C 11/12/15 [id number]  Rolan BuccoMelanie Belfi, MD 11/15/15 [phone]

## 2015-11-11 NOTE — ED Notes (Signed)
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

## 2015-11-11 NOTE — ED Notes (Signed)
[facility] Expected date:  Expected time:  Means of arrival:  Comments: EMS  24 yo M

## 2015-11-12 NOTE — ED Notes (Signed)
Pt to CT

## 2015-11-12 NOTE — Discharge Instructions (Signed)
Continue taking usual home medications, you can use motrin as needed for pain/fever Rest, drink plenty of fluids, follow the attached diet for the next 2-3 days - this will help with your abdominal pain and diarrhea.  Please follow up with your primary doctor in 2-3 days for discussion of your diagnoses and further evaluation after today's visit; if you do not have a primary care doctor use the resource guide provided to find one; Please return to the ER for fever higher than 100.4 not controlled with motrin, new or worsening symptoms, any additional concerns.

## 2015-11-12 NOTE — ED Provider Notes (Signed)
ED ECG REPORT   Date: 11/12/2015  Rate: 74  Rhythm: normal sinus rhythm  QRS Axis: normal  Intervals: normal  ST/T Wave abnormalities: normal  Conduction Disutrbances:none  Narrative Interpretation:   Old EKG Reviewed: unchanged  I have personally reviewed the EKG tracing and agree with the computerized printout as noted.   Rolan BuccoMelanie Nadalee Neiswender, MD 11/12/15 [phone]

## 2015-11-17 ENCOUNTER — Encounter (HOSPITAL_COMMUNITY): Payer: Self-pay | Admitting: *Deleted

## 2015-11-17 ENCOUNTER — Emergency Department (HOSPITAL_COMMUNITY)
Admission: EM | Admit: 2015-11-17 | Discharge: 2015-11-17 | Disposition: A | Discharge status: Home / Self Care | Payer: Self-pay | Attending: Emergency Medicine | Admitting: Emergency Medicine

## 2015-11-17 DIAGNOSIS — F17210 Nicotine dependence, cigarettes, uncomplicated: Secondary | ICD-10-CM | POA: Insufficient documentation

## 2015-11-17 DIAGNOSIS — A539 Syphilis, unspecified: Secondary | ICD-10-CM

## 2015-11-17 DIAGNOSIS — R5383 Other fatigue: Secondary | ICD-10-CM | POA: Insufficient documentation

## 2015-11-17 DIAGNOSIS — Z792 Long term (current) use of antibiotics: Secondary | ICD-10-CM | POA: Insufficient documentation

## 2015-11-17 DIAGNOSIS — E119 Type 2 diabetes mellitus without complications: Secondary | ICD-10-CM | POA: Insufficient documentation

## 2015-11-17 DIAGNOSIS — A510 Primary genital syphilis: Secondary | ICD-10-CM | POA: Insufficient documentation

## 2015-11-17 DIAGNOSIS — Z8774 Personal history of (corrected) congenital malformations of heart and circulatory system: Secondary | ICD-10-CM | POA: Insufficient documentation

## 2015-11-17 DIAGNOSIS — Z8673 Personal history of transient ischemic attack (TIA), and cerebral infarction without residual deficits: Secondary | ICD-10-CM | POA: Insufficient documentation

## 2015-11-17 MED ORDER — DIPHENHYDRAMINE HCL 25 MG PO TABS: 25.0000 mg | ORAL_TABLET | Freq: Four times a day (QID) | ORAL | Status: DC

## 2015-11-17 MED ORDER — PENICILLIN G BENZATHINE 1200000 UNIT/2ML IM SUSP
2.4000 10*6.[IU] | Freq: Once | INTRAMUSCULAR | Status: AC
  Administered 2015-11-17: 2.4 10*6.[IU] via INTRAMUSCULAR
  Filled 2015-11-17: qty 4

## 2015-11-17 MED ORDER — DIPHENHYDRAMINE HCL 25 MG PO CAPS
25.0000 mg | ORAL_CAPSULE | Freq: Once | ORAL | Status: AC
  Administered 2015-11-17: 25 mg via ORAL
  Filled 2015-11-17: qty 1

## 2015-11-17 NOTE — Discharge Instructions (Signed)
Syphilis Follow-up with a primary care provider using the resource guide below. You will need repeat serological testing at 6 and 12 months. Please inform your partners that they will need to be tested also. Syphilis is an infectious disease. It can cause serious complications if left untreated.  CAUSES  Syphilis is caused by a type of bacteria called Treponema pallidum. It is most commonly spread through sexual contact. Syphilis may also spread to a fetus through the blood of the mother.  SIGNS AND SYMPTOMS Symptoms vary depending on the stage of the disease. Some symptoms may disappear without treatment. However, this does not mean that the infection is gone. One form of syphilis (called latent syphilis) has no symptoms.  Primary Syphilis  Painless sores (chancres) in and around the genital organs and mouth.  Swollen lymph nodes near the sores. Secondary Syphilis  A rash or sores over any portion of the body, including the palms of the hands and soles of the feet.  Fever.  Headache.  Sore throat.  Swollen lymph nodes.  New sores in the mouth or on the genitals.  Feeling generally ill.  Having pain in the joints. Tertiary Syphilis The third stage of syphilis involves severe damage to different organs in the body, such as the brain, spinal cord, and heart. Signs and symptoms may include:   Dementia.  Personality and mood changes.  Difficulty walking.  Heart failure.  Fainting.  Enlargement (aneurysm) of the aorta.  Tumors of the skin, bones, or liver.  Muscle weakness.  Sudden "lightning" pains, numbness, or tingling.  Problems with coordination.  Vision changes. DIAGNOSIS   A physical exam will be done.  Blood tests will be done to confirm the diagnosis.  If the disease is in the first or second stages, a fluid (drainage) sample from a sore or rash may be examined under a microscope to detect the disease-causing bacteria.  Fluid around the spine may  need to be examined to detect brain damage or inflammation of the brain lining (meningitis).  If the disease is in the third stage, X-rays, CT scans, MRIs, echocardiograms, ultrasounds, or cardiac catheterization may also be done to detect disease of the heart, aorta, or brain. TREATMENT  Syphilis can be cured with antibiotic medicine if a diagnosis is made early. During the first day of treatment, you may experience fever, chills, headache, nausea, or aching all over your body. This is a normal reaction to the antibiotics.  HOME CARE INSTRUCTIONS   Take your antibiotic medicine as directed by your health care provider. Finish the antibiotic even if you start to feel better. Incomplete treatment will put you at risk for continued infection and could be life threatening.  Take medicines only as directed by your health care provider.  Do not have sexual intercourse until your treatment is completed or as directed by your health care provider.  Inform your recent sexual partners that you were diagnosed with syphilis. They need to seek care and treatment, even if they have no symptoms. It is necessary that all your sexual partners be tested for infection and treated if they have the disease.  Keep all follow-up visits as directed by your health care provider. It is important to keep all your appointments.  If your test results are not ready during your visit, make an appointment with your health care provider to find out the results. Do not assume everything is normal if you have not heard from your health care provider or the medical facility.  It is your responsibility to get your test results. SEEK MEDICAL CARE IF:  You continue to have any of the following 24 hours after beginning treatment:  Fever.  Chills.  Headache.  Nausea.  Aching all over your body.  You have symptoms of an allergic reaction to medicine, such as:  Chills.  A headache.  Light-headedness.  A new rash  (especially hives).  Difficulty breathing. MAKE SURE YOU:   Understand these instructions.  Will watch your condition.  Will get help right away if you are not doing well or get worse.   This information is not intended to replace advice given to you by your health care provider. Make sure you discuss any questions you have with your health care provider.   Document Released: 09/12/2013 Document Revised: 12/13/2014 Document Reviewed: 09/12/2013 Elsevier Interactive Patient Education 2016 [employer].  Emergency Department Resource Guide 1) Find a Doctor and Pay Out of Pocket Although you won't have to find out who is covered by your insurance plan, it is a good idea to ask around and get recommendations. You will then need to call the office and see if the doctor you have chosen will accept you as a new patient and what types of options they offer for patients who are self-pay. Some doctors offer discounts or will set up payment plans for their patients who do not have insurance, but you will need to ask so you aren't surprised when you get to your appointment.  2) Contact Your Local Health Department Not all health departments have doctors that can see patients for sick visits, but many do, so it is worth a call to see if yours does. If you don't know where your local health department is, you can check in your phone book. The CDC also has a tool to help you locate your state's health department, and many state websites also have listings of all of their local health departments.  3) Find a Walk-in Clinic If your illness is not likely to be very severe or complicated, you may want to try a walk in clinic. These are popping up all over the country in pharmacies, drugstores, and shopping centers. They're usually staffed by nurse practitioners or physician assistants that have been trained to treat common illnesses and complaints. They're usually fairly quick and inexpensive. However, if you  have serious medical issues or chronic medical problems, these are probably not your best option.  No Primary Care Doctor: - Call Health Connect at  [phone] - they can help you locate a primary care doctor that  accepts your insurance, provides certain services, etc. - Physician Referral Service- [phone]  Chronic Pain Problems: Organization         Address  Phone   Notes  [facility]  [phone] Patients need to be referred by their primary care doctor.   Medication Assistance: Organization         Address  Phone   Notes  [facility] Medication [facility] [address] [place] [place], [place] [address] [phone] --Must be a resident of [facility] -- Must have NO insurance coverage whatsoever (no Medicaid/ Medicare, etc.) -- The pt. MUST have a primary care doctor that directs their care regularly and follows them in the community   [facility]ist  [phone]   [employer]  [phone]    Agencies that provide inexpensive medical care: Organization         Address  Phone   Notes  [facility]  [phone]   [facility]    [phone]   [facility] [address] [place], [place] [address] [phone]   [facility] [address] [place] [phone]   Planned Parenthood    [phone]   [facility]    [phone]   Community Health and [facility]  [address], [place] Phone:  [phone], Fax:  [phone] Hours of Operation:  9 am - 6 pm, M-F.  Also accepts Medicaid/Medicare and self-pay.  [facility]  [address], [place] Phone: [phone], Fax: [phone]. Hours of Operation:  8:30 am - 5:30 pm, M-F.  Also accepts Medicaid and self-pay.  [facility] [place] [address] [place] Phone: [phone]   [facility] [address]  Natasha Bence [place], [place] [phone], Ext. [phone] Mondays & Thursdays: 7-9 AM.  First 15 patients are seen on a first come, first serve basis.    Medicaid-accepting [facility] Providers:  Organization         Address  Phone   Notes  [facility] [address], [place] [phone] Also accepts self-pay patients.  [facility] [address] Laurell Josephs [place], [place]  [phone]   [facility] [address], [place] [phone]   [facility] [address] [place] [phone]   Renaye Rakers [address], [place]   [phone] Only accepts [place] Access [place] patients after they have their name applied to their card.   Self-Pay (no insurance) in [facility]:  Organization         Address  Phone   Notes  Sickle Cell Patients, [facility] [address] [place], [place] [phone]   [facility] [address] [place], [place] [phone]   [facility]  [address], [place] [phone]   [facility]/Dr. Osei-Bonsu  [address], [place] or [address], [place] [phone] Phone number for both [place] and [place] locations is the same.  Urgent Medical and [facility] [address], [place] [phone]   [facility] [address], [place] or [address] [phone]   [facility] [address], [place] [phone], phone; [phone], fax Sees patients 1st and 3rd Saturday of every month.  Must not qualify for public or private insurance (i.e. Medicaid, Medicare, [place] Health Choice, Veterans' Benefits)  Household income should be no more than 200% of the poverty level The clinic cannot treat you if you are pregnant or think you are pregnant   Sexually transmitted diseases are not treated at the clinic.    Dental Care: Organization         Address  Phone  Notes  [facility] Department of [facility] [address] [place], [place] [phone] Accepts children up to age 81 who are enrolled in [place] or [place] Health Choice; pregnant women with a Medicaid card; and children who have applied for Medicaid or [place] Health Choice, but were declined, whose parents can pay a reduced fee at time of service.  [facility] [place]  [address], [place] [phone] Accepts children up to age 24 who are enrolled in [place] or [place] Health Choice; pregnant women with a Medicaid card; and children who have applied for Medicaid or  Health Choice, but were declined, whose parents can pay a reduced fee at time of service.  [facility]  [address], [place] [phone] Patients are seen by appointment only. Walk-ins are not accepted. [facility] will see patients 24 years of age and older. Monday - Tuesday (8am-5pm) Most Wednesdays (8:30-5pm) $30 per visit, cash only  [facility]  [address], [facility] [phone] Patients are seen by appointment only. Walk-ins are not accepted. [facility] will see patients 24 years of age and older. One Wednesday Evening (Monthly: Volunteer Based).  $30 per visit, cash only  [facility]  [phone] for adults; Children under age 144, call Graduate Pediatric Dentistry at [phone]) [phone]. Children aged 604-14, please call [phone] to request a pediatric application.  Dental services are provided in all areas of dental care including fillings, crowns and bridges, complete and partial dentures, implants, gum treatment, root canals, and extractions. Preventive care is also provided. Treatment is provided to both adults and children. Patients  are selected via a lottery and there is often a waiting list.   [facility] [address], [place]  [phone] [email or web address]   [facility] [address], [place], [place] [phone], Ext. [phone] Second and Fourth Thursday of each month, opens at 6:30 AM; Clinic ends at 9 AM.  Patients are seen on a first-come first-served basis, and a limited number are seen during each clinic.   [facility]  [address], [place], [place] [phone]   Eligibility Requirements You must have lived in [place], [place], or [place] counties for at least the last three months.   You cannot be eligible for state or federal sponsored National Cityhealthcare insurance, including CIGNAVeterans Administration, [place]icaid, or [employer]icare.   You generally cannot be eligible for healthcare insurance through your employer.    How to apply: Eligibility screenings are held every Tuesday and Wednesday afternoon from 1:00 pm until 4:00 pm. You do not need an appointment for the interview!  [facility] [address], [place], [place] [phone]   [facility]  [phone]   [facility]  [phone]   [facility]  [phone]    Behavioral Health Resources in the Community: Intensive Outpatient Programs Organization         Address  Phone  Notes  [facility] [address], [place], [place] [phone]   [facility] [address], [place], [place] [phone]   [facility] [address], [place], [place]  [phone]   [facility] [address],  [place], [place] [phone] or [phone]   Substance Abuse Resources Organization         Address  Phone  Notes  [facility]  [phone]   [facility]  [phone]   [facility]  [phone]   [facility]  [phone]    Residential & Outpatient Substance Abuse Program  [phone]   Psychological Services Organization         Address  Phone  Notes  [place] [address] [place] or [phone]    Mobile Crisis Teams Organization         Address  Phone  Notes  [facility], Mobile Crisis Care Unit  [phone]   [facility]  [address] [place], [place]   [place] [address], [place] [phone]    Self-Help/Support Groups Organization         Address  Phone             Notes  Many[place] of [place] - variety of support groups  [place] Call for more information  Narcotics Anonymous (NA), [facility] [address], [employer] [place]  2 meetings at this location   Special educational needs teacher         Address  Phone  Notes  [facility],    [place]  [phone]   [facility]  [address] [place] [address], [place], [place]   [place], [place] [phone] Admissions: 8am-3pm M-F  Incentives Substance [place] [address]    [place], [place] [phone]   [facility] [address] [place], [place], [place]   [facility] [address],  [place], [place]   [facility] - Intensive Outpatient [address] Kristeen [address], [place], [place]   [facility] [address]  [place], [place] [phone] or [phone]   [facility] ([facility]) [address], [place], [place] Accepts Medicaid  [facility] [place] [address]  [place] [phone] Substance Abuse/Addiction Treatment   [facility] [place] Organization         Address  Phone  Notes  [facility]  [phone]   Domenic Schwab, PhD [address] Arlis Porta [place], [place]   [phone] or [phone]   [facility] [place], [place] [phone]   [facility] [address], [place], [place] [phone] Insurance/Medicaid/sponsorship through [facility] and Families [address] [place]                                    [place], [place] [phone] [place] [address], [place] [phone]    Dr. Adele Schilder  [phone]   [facility] Dept. 1) [address], [place] 2) [place] 3)  [place] [address], [place] [phone]  [phone]   [place] [phone] or [phone] (After Hours)

## 2015-11-17 NOTE — ED Notes (Signed)
PT reports when he woke up this AM he had a rash to palms of hand ,his feet were burning and his skin was dry on his face.

## 2015-11-17 NOTE — ED Notes (Signed)
Declined W/C at D/C and was escorted to lobby by RN. 

## 2015-11-17 NOTE — ED Provider Notes (Signed)
CSN: [id number]     Arrival date & time 11/17/15  1251 History  By signing my name below, I, Tanda Rockers, attest that this documentation has been prepared under the direction and in the presence of [employer], PA-C. Electronically Signed: Tanda Rockers, ED Scribe. 11/17/2015. 3:38 PM.   Chief Complaint  Patient presents with  . Rash   The history is provided by the patient. No language interpreter was used.     HPI Comments: [name] [name] is a 24 y.o. male who presents to the Emergency Department complaining of gradual onset, constant, burning itching, rash to palms of hands, soles of feet, and face that began this morning. No new detergent, lotions, soaps, creams, or food. He has not taken anything for his rash. Pt has never had symptoms like this in the past. He does mention feeling more fatigued than usual a couple of days ago but denies fever, chills, penile sores, penile discharge, or any other associated symptoms. Last sexual encounter was 5 days ago. He has had 1 partner in the past 6 months. Hx Trichomoniasis 3 years ago, but denies hx of any other STDs.    Past Medical History  Diagnosis Date  . Stroke (HCC)   . Heart defect   . Diabetes [facility])    Past Surgical History  Procedure Laterality Date  . Cardiac surgery     History reviewed. No pertinent family history. Social History  Substance Use Topics  . Smoking status: Current Every Day Smoker -- 0.50 packs/day    Types: Cigarettes  . Smokeless tobacco: None  . Alcohol Use: No    Review of Systems  Constitutional: Positive for fatigue. Negative for fever and chills.  Genitourinary: Negative for discharge and genital sores.  Skin: Positive for rash.   Allergies  Review of patient's allergies indicates no known allergies.  Home Medications   Prior to Admission medications   Medication Sig Start Date End Date Taking? Authorizing Provider  amoxicillin-clavulanate (AUGMENTIN) 875-125 MG tablet Take 1 tablet  by mouth 2 (two) times daily. 09/06/15  Yes Tatyana Kirichenko, PA-C  doxycycline (VIBRAMYCIN) 100 MG capsule Take 1 capsule (100 mg total) by mouth 2 (two) times daily. One po bid x 7 days 09/30/14  Yes Marissa Sciacca, PA-C  diphenhydrAMINE (BENADRYL) 25 MG tablet Take 1 tablet (25 mg total) by mouth every 6 (six) hours. 11/17/15   Catha Gosselin, PA-C   Triage Vitals: BP 122/65 mmHg  Pulse 68  Temp(Src) 98 F (36.7 C) (Oral)  Resp 16  Ht 6' (1.829 m)  Wt 197 lb 5 oz (89.5 kg)  BMI 26.75 kg/m2  SpO2 99%   Physical Exam  Constitutional: He is oriented to person, place, and time. He appears well-developed and well-nourished. No distress.  HENT:  Head: Normocephalic and atraumatic.  Eyes: Conjunctivae and EOM are normal.  Neck: Neck supple. No tracheal deviation present.  Cardiovascular: Normal rate.   Pulmonary/Chest: Effort normal. No respiratory distress.  Abdominal: Soft. He exhibits no distension.  Genitourinary:   GU exam: Chaperone present. No canker sores on the penis.  Musculoskeletal: Normal range of motion.  Neurological: He is alert and oriented to person, place, and time.  Skin: Skin is warm and dry. Rash noted. There is erythema.  Multiple erythematous, macular rash that is circular in appearance and involves the palm are surfaces of bilateral hands and the soles of bilateral feet. He also has a a few lesions on the face.  Psychiatric: He has a normal  mood and affect. His behavior is normal.  Nursing note and vitals reviewed.   ED Course  Procedures (including critical care time)  DIAGNOSTIC STUDIES: Oxygen Saturation is 99% on RA, normal by my interpretation.    COORDINATION OF CARE: 2:11 PM-Discussed treatment plan with pt at bedside and pt agreed to plan.   Labs Review Labs Reviewed  RPR  HIV ANTIBODY (ROUTINE TESTING)  GC/CHLAMYDIA PROBE AMP ([place]) NOT AT [facility]    Imaging Review No results found.   EKG Interpretation None      MDM    Final diagnoses:  Syphilis in male    Patient presents for rash on palms and soles that began this morning. He is well-appearing and vital signs are stable. His rash is similar in appearance to syphilis. He has no neurological symptoms. Pt agreeable to urethral swap after discussing that the rash may be related to syphilis. STD panel was obtained. I discussed with the patient that the hospital would inform him if his STD results were positive. He was prescribed Benadryl or itching. Medications  diphenhydrAMINE (BENADRYL) capsule 25 mg (25 mg Oral Given 11/17/15 1502)  penicillin g benzathine (BICILLIN LA) 1200000 UNIT/2ML injection 2.4 Million Units (2.4 Million Units Intramuscular Given 11/17/15 1503)   I explained that the patient should inform his partner so that they can be checked. Also discussed return precautions as well as follow-up and he verbally agrees with the plan. Filed Vitals:   11/17/15 1321  BP: 122/65  Pulse: 68  Temp: 98 F (36.7 C)  Resp: 16   I personally performed the services described in this documentation, which was scribed in my presence. The recorded information has been reviewed and is accurate.      Catha GosselinHanna Patel-Mills, PA-C 11/17/15 1538  Arby BarretteMarcy Pfeiffer, MD 11/18/15 [phone]

## 2015-11-18 LAB — GC/CHLAMYDIA PROBE AMP (~~LOC~~) NOT AT ARMC
Chlamydia: NEGATIVE
Neisseria Gonorrhea: NEGATIVE

## 2015-11-20 LAB — RPR: RPR Ser Ql: NONREACTIVE

## 2015-11-20 LAB — HIV ANTIBODY (ROUTINE TESTING W REFLEX): HIV SCREEN 4TH GENERATION: NONREACTIVE

## 2016-06-18 ENCOUNTER — Emergency Department (HOSPITAL_COMMUNITY): Payer: Medicaid Other

## 2016-06-18 ENCOUNTER — Emergency Department (HOSPITAL_COMMUNITY)
Admission: EM | Admit: 2016-06-18 | Discharge: 2016-06-18 | Disposition: A | Discharge status: Home / Self Care | Payer: Medicaid Other | Attending: Emergency Medicine | Admitting: Emergency Medicine

## 2016-06-18 ENCOUNTER — Encounter (HOSPITAL_COMMUNITY): Payer: Self-pay | Admitting: *Deleted

## 2016-06-18 DIAGNOSIS — R079 Chest pain, unspecified: Secondary | ICD-10-CM | POA: Diagnosis present

## 2016-06-18 DIAGNOSIS — R0789 Other chest pain: Secondary | ICD-10-CM | POA: Diagnosis not present

## 2016-06-18 DIAGNOSIS — E119 Type 2 diabetes mellitus without complications: Secondary | ICD-10-CM | POA: Insufficient documentation

## 2016-06-18 DIAGNOSIS — R51 Headache: Secondary | ICD-10-CM | POA: Insufficient documentation

## 2016-06-18 DIAGNOSIS — F17210 Nicotine dependence, cigarettes, uncomplicated: Secondary | ICD-10-CM | POA: Insufficient documentation

## 2016-06-18 DIAGNOSIS — R519 Headache, unspecified: Secondary | ICD-10-CM

## 2016-06-18 DIAGNOSIS — Z8673 Personal history of transient ischemic attack (TIA), and cerebral infarction without residual deficits: Secondary | ICD-10-CM | POA: Diagnosis not present

## 2016-06-18 LAB — CBC
HCT: 37.8 % — ABNORMAL LOW (ref 39.0–52.0)
Hemoglobin: 12.8 g/dL — ABNORMAL LOW (ref 13.0–17.0)
MCH: 31 pg (ref 26.0–34.0)
MCHC: 33.9 g/dL (ref 30.0–36.0)
MCV: 91.5 fL (ref 78.0–100.0)
Platelets: 183 10*3/uL (ref 150–400)
RBC: 4.13 MIL/uL — ABNORMAL LOW (ref 4.22–5.81)
RDW: 13.2 % (ref 11.5–15.5)
WBC: 7.5 10*3/uL (ref 4.0–10.5)

## 2016-06-18 LAB — BASIC METABOLIC PANEL
ANION GAP: 5 (ref 5–15)
BUN: 9 mg/dL (ref 6–20)
CHLORIDE: 108 mmol/L (ref 101–111)
CO2: 25 mmol/L (ref 22–32)
Calcium: 9.2 mg/dL (ref 8.9–10.3)
Creatinine, Ser: 0.86 mg/dL (ref 0.61–1.24)
GFR calc Af Amer: >60 mL/min (ref >60)
GFR calc non Af Amer: >60 mL/min (ref >60)
Glucose, Bld: 112 mg/dL — ABNORMAL HIGH (ref 65–99)
Potassium: 4.1 mmol/L (ref 3.5–5.1)
Sodium: 138 mmol/L (ref 135–145)

## 2016-06-18 LAB — I-STAT TROPONIN, ED: Troponin i, poc: 0 ng/mL (ref 0.00–0.08)

## 2016-06-18 MED ORDER — IBUPROFEN 800 MG PO TABS
800.0000 mg | ORAL_TABLET | Freq: Once | ORAL | Status: AC
  Administered 2016-06-18: 800 mg via ORAL
  Filled 2016-06-18: qty 1

## 2016-06-18 MED ORDER — ASPIRIN 81 MG PO CHEW: 324.0000 mg | CHEWABLE_TABLET | Freq: Once | ORAL | Status: DC

## 2016-06-18 MED ORDER — ASPIRIN EC 325 MG PO TBEC
650.0000 mg | DELAYED_RELEASE_TABLET | Freq: Once | ORAL | Status: AC
  Administered 2016-06-18: 650 mg via ORAL
  Filled 2016-06-18 (×2): qty 2

## 2016-06-18 NOTE — Discharge Instructions (Signed)
Chest Wall Pain °Chest wall pain is pain in or around the bones and muscles of your chest. Sometimes, an injury causes this pain. Sometimes, the cause may not be known. This pain may take several weeks or longer to get better. °HOME CARE INSTRUCTIONS  °Pay attention to any changes in your symptoms. Take these actions to help with your pain:  °· Rest as told by your health care provider.   °· Avoid activities that cause pain. These include any activities that use your chest muscles or your abdominal and side muscles to lift heavy items.    °· If directed, apply ice to the painful area: °· Put ice in a plastic bag. °· Place a towel between your skin and the bag. °· Leave the ice on for 20 minutes, 2-3 times per day. °· Take over-the-counter and prescription medicines only as told by your health care provider. °· Do not use tobacco products, including cigarettes, chewing tobacco, and e-cigarettes. If you need help quitting, ask your health care provider. °· Keep all follow-up visits as told by your health care provider. This is important. °SEEK MEDICAL CARE IF: °· You have a fever. °· Your chest pain becomes worse. °· You have new symptoms. °SEEK IMMEDIATE MEDICAL CARE IF: °· You have nausea or vomiting. °· You feel sweaty or light-headed. °· You have a cough with phlegm (sputum) or you cough up blood. °· You develop shortness of breath. °  °This information is not intended to replace advice given to you by your health care provider. Make sure you discuss any questions you have with your health care provider. °  °Document Released: 11/22/2005 Document Revised: 08/13/2015 Document Reviewed: 02/17/2015 °Elsevier Interactive Patient Education ©2016 [employer] °General Headache Without Cause °A headache is pain or discomfort felt around the head or neck area. The specific cause of a headache may not be found. There are many causes and types of headaches. A few common ones are: °· Tension headaches. °· Migraine  headaches. °· Cluster headaches. °· Chronic daily headaches. °HOME CARE INSTRUCTIONS  °Watch your condition for any changes. Take these steps to help with your condition: °Managing Pain °· Take over-the-counter and prescription medicines only as told by your health care provider. °· Lie down in a dark, quiet room when you have a headache. °· If directed, apply ice to the head and neck area: °¨ Put ice in a plastic bag. °¨ Place a towel between your skin and the bag. °¨ Leave the ice on for 20 minutes, 2-3 times per day. °· Use a heating pad or hot shower to apply heat to the head and neck area as told by your health care provider. °· Keep lights dim if bright lights bother you or make your headaches worse. °Eating and Drinking °· Eat meals on a regular schedule. °· Limit alcohol use. °· Decrease the amount of caffeine you drink, or stop drinking caffeine. °General Instructions °· Keep all follow-up visits as told by your health care provider. This is important. °· Keep a headache journal to help find out what may trigger your headaches. For example, write down: °¨ What you eat and drink. °¨ How much sleep you get. °¨ Any change to your diet or medicines. °· Try massage or other relaxation techniques. °· Limit stress. °· Sit up straight, and do not tense your muscles. °· Do not use tobacco products, including cigarettes, chewing tobacco, or e-cigarettes. If you need help quitting, ask your health care provider. °· Exercise regularly as told   by your health care provider. °· Sleep on a regular schedule. Get 7-9 hours of sleep, or the amount recommended by your health care provider. °SEEK MEDICAL CARE IF:  °· Your symptoms are not helped by medicine. °· You have a headache that is different from the usual headache. °· You have nausea or you vomit. °· You have a fever. °SEEK IMMEDIATE MEDICAL CARE IF:  °· Your headache becomes severe. °· You have repeated vomiting. °· You have a stiff neck. °· You have a loss of  vision. °· You have problems with speech. °· You have pain in the eye or ear. °· You have muscular weakness or loss of muscle control. °· You lose your balance or have trouble walking. °· You feel faint or pass out. °· You have confusion. °  °This information is not intended to replace advice given to you by your health care provider. Make sure you discuss any questions you have with your health care provider. °  °Document Released: 11/22/2005 Document Revised: 08/13/2015 Document Reviewed: 03/17/2015 °Elsevier Interactive Patient Education ©2016 [employer]. ° °

## 2016-06-18 NOTE — ED Notes (Signed)
Pt is here with headache for 4 days and intermittent chest pain on both sides of his chest.

## 2016-06-18 NOTE — ED Notes (Signed)
Spoke with Dr. Clydene PughKnott about 650mg  EC asa. MD wants that dose for pts headache

## 2016-06-18 NOTE — ED Notes (Signed)
Dr. Clydene PughKnott cancelled code stemi

## 2016-06-18 NOTE — ED Notes (Signed)
Pt and family understood dc material. NAD noted. Reviewed paperwork with pt and family

## 2016-06-18 NOTE — ED Provider Notes (Signed)
CSN: [id number]     Arrival date & time 06/18/16  1827 History   First MD Initiated Contact with Patient 06/18/16 1921     Chief Complaint  Patient presents with  . Headache  . Chest Pain     (Consider location/radiation/quality/duration/timing/severity/associated sxs/prior Treatment) Patient is a 25 y.o. male presenting with headaches. The history is provided by the patient.  Headache Pain location:  Frontal Quality:  Dull Radiates to:  Does not radiate Onset quality:  Gradual Duration:  4 days Timing:  Intermittent Progression:  Waxing and waning Chronicity:  New Similar to prior headaches: yes   Context: activity   Relieved by:  Nothing Worsened by:  Nothing Ineffective treatments:  Acetaminophen Associated symptoms: no blurred vision, no fever, no numbness, no visual change and no vomiting     Past Medical History  Diagnosis Date  . Stroke (HCC)   . Heart defect   . Diabetes [facility](HCC)    Past Surgical History  Procedure Laterality Date  . Cardiac surgery     No family history on file. Social History  Substance Use Topics  . Smoking status: Current Every Day Smoker -- 0.50 packs/day    Types: Cigarettes  . Smokeless tobacco: None  . Alcohol Use: No    Review of Systems  Constitutional: Negative for fever.  Eyes: Negative for blurred vision.  Gastrointestinal: Negative for vomiting.  Neurological: Positive for headaches. Negative for numbness.  All other systems reviewed and are negative.     Allergies  Review of patient's allergies indicates no known allergies.  Home Medications   Prior to Admission medications   Medication Sig Start Date End Date Taking? Authorizing Provider  amoxicillin-clavulanate (AUGMENTIN) 875-125 MG tablet Take 1 tablet by mouth 2 (two) times daily. 09/06/15   Tatyana Kirichenko, PA-C  diphenhydrAMINE (BENADRYL) 25 MG tablet Take 1 tablet (25 mg total) by mouth every 6 (six) hours. 11/17/15   Hanna Patel-Mills, PA-C  doxycycline  (VIBRAMYCIN) 100 MG capsule Take 1 capsule (100 mg total) by mouth 2 (two) times daily. One po bid x 7 days 09/30/14   Marissa Sciacca, PA-C   BP 124/78 mmHg  Pulse 60  Temp(Src) 98.1 F (36.7 C) (Oral)  Resp 19  SpO2 98% Physical Exam  Constitutional: He is oriented to person, place, and time. He appears well-developed and well-nourished. No distress.  HENT:  Head: Normocephalic and atraumatic.  Eyes: Conjunctivae are normal.  Neck: Neck supple. No tracheal deviation present.  Cardiovascular: Normal rate, regular rhythm and normal heart sounds.   Pulmonary/Chest: Effort normal and breath sounds normal. No respiratory distress.  Abdominal: Soft. He exhibits no distension.  Neurological: He is alert and oriented to person, place, and time.  Normal finger to nose testing and rapid alternating movement   Skin: Skin is warm and dry.  Psychiatric: He has a normal mood and affect.  Vitals reviewed.   ED Course  Procedures (including critical care time) Labs Review Labs Reviewed  BASIC METABOLIC PANEL - Abnormal; Notable for the following:    Glucose, Bld 112 (*)    All other components within normal limits  CBC - Abnormal; Notable for the following:    RBC 4.13 (*)    Hemoglobin 12.8 (*)    HCT 37.8 (*)    All other components within normal limits  Rosezena SensorI-STAT TROPOININ, ED    Imaging Review Dg Chest 2 View  06/18/2016  CLINICAL DATA:  25 year old male with acute chest pain. EXAM: CHEST  2 VIEW  COMPARISON:  11/11/2015 and prior chest radiographs FINDINGS: The cardiomediastinal silhouette is unremarkable. Atrial septal device again noted. There is no evidence of focal airspace disease, pulmonary edema, suspicious pulmonary nodule/mass, pleural effusion, or pneumothorax. No acute bony abnormalities are identified. IMPRESSION: No active cardiopulmonary disease. Electronically Signed   By: Harmon Pier M.D.   On: 06/18/2016 20:10   I have personally reviewed and evaluated these images and  lab results as part of my medical decision-making.   EKG Interpretation   Date/Time:  Friday June 18 2016 19:09:20 EDT Ventricular Rate:  71 PR Interval:  218 QRS Duration: 114 QT Interval:  384 QTC Calculation: 417 R Axis:   20 Text Interpretation:  Sinus rhythm with sinus arrhythmia with 1st degree  A-V block Benign early repolarization Incomplete right bundle branch block  No significant change since last tracing Confirmed by Vannak Montenegro MD, Tomeshia Pizzi  ([id number]) on 06/18/2016 7:24:09 PM      MDM   Final diagnoses:  Nonintractable headache, unspecified chronicity pattern, unspecified headache type  Atypical chest pain    25 y.o. male presents with headache for 4 days and noting sharp atypical chest pain over same period. EKG unchanged from prior. Troponin is negative despite multiple days of pain. No neurologic deficit. Provided aspirin and ibuprofen for headache. Low risk for ACS by heart score. Plan to follow up with PCP as needed and return precautions discussed for worsening or new concerning symptoms.     Lyndal Pulley, MD 06/19/16 0230

## 2016-06-18 NOTE — ED Notes (Signed)
edp called code stemi

## 2016-07-12 ENCOUNTER — Emergency Department (HOSPITAL_COMMUNITY)
Admission: EM | Admit: 2016-07-12 | Discharge: 2016-07-12 | Disposition: A | Discharge status: Home / Self Care | Payer: Medicaid Other | Attending: Emergency Medicine | Admitting: Emergency Medicine

## 2016-07-12 DIAGNOSIS — Z79899 Other long term (current) drug therapy: Secondary | ICD-10-CM | POA: Diagnosis not present

## 2016-07-12 DIAGNOSIS — Z8673 Personal history of transient ischemic attack (TIA), and cerebral infarction without residual deficits: Secondary | ICD-10-CM | POA: Diagnosis not present

## 2016-07-12 DIAGNOSIS — E119 Type 2 diabetes mellitus without complications: Secondary | ICD-10-CM | POA: Insufficient documentation

## 2016-07-12 DIAGNOSIS — F909 Attention-deficit hyperactivity disorder, unspecified type: Secondary | ICD-10-CM | POA: Diagnosis not present

## 2016-07-12 DIAGNOSIS — R197 Diarrhea, unspecified: Secondary | ICD-10-CM | POA: Diagnosis not present

## 2016-07-12 DIAGNOSIS — F17210 Nicotine dependence, cigarettes, uncomplicated: Secondary | ICD-10-CM | POA: Diagnosis not present

## 2016-07-12 LAB — URINALYSIS, ROUTINE W REFLEX MICROSCOPIC
Bilirubin Urine: NEGATIVE
Glucose, UA: NEGATIVE mg/dL
Hgb urine dipstick: NEGATIVE
Ketones, ur: 15 mg/dL — AB
Leukocytes, UA: NEGATIVE
Nitrite: NEGATIVE
Protein, ur: NEGATIVE mg/dL
Specific Gravity, Urine: 1.034 — ABNORMAL HIGH (ref 1.005–1.030)
pH: 6 (ref 5.0–8.0)

## 2016-07-12 LAB — COMPREHENSIVE METABOLIC PANEL
ALBUMIN: 4.4 g/dL (ref 3.5–5.0)
ALT: 34 U/L (ref 17–63)
ANION GAP: 7 (ref 5–15)
AST: 41 U/L (ref 15–41)
Alkaline Phosphatase: 52 U/L (ref 38–126)
BUN: 11 mg/dL (ref 6–20)
CO2: 26 mmol/L (ref 22–32)
Calcium: 9.2 mg/dL (ref 8.9–10.3)
Chloride: 107 mmol/L (ref 101–111)
Creatinine, Ser: 0.92 mg/dL (ref 0.61–1.24)
GFR calc Af Amer: >60 mL/min (ref >60)
GFR calc non Af Amer: >60 mL/min (ref >60)
GLUCOSE: 72 mg/dL (ref 65–99)
POTASSIUM: 4.1 mmol/L (ref 3.5–5.1)
SODIUM: 140 mmol/L (ref 135–145)
Total Bilirubin: 0.6 mg/dL (ref 0.3–1.2)
Total Protein: 7.2 g/dL (ref 6.5–8.1)

## 2016-07-12 LAB — CBC
HEMATOCRIT: 43.7 % (ref 39.0–52.0)
HEMOGLOBIN: 14.7 g/dL (ref 13.0–17.0)
MCH: 30.7 pg (ref 26.0–34.0)
MCHC: 33.6 g/dL (ref 30.0–36.0)
MCV: 91.2 fL (ref 78.0–100.0)
Platelets: 214 10*3/uL (ref 150–400)
RBC: 4.79 MIL/uL (ref 4.22–5.81)
RDW: 12.5 % (ref 11.5–15.5)
WBC: 7.9 10*3/uL (ref 4.0–10.5)

## 2016-07-12 LAB — POC OCCULT BLOOD, ED: Fecal Occult Bld: NEGATIVE

## 2016-07-12 LAB — LIPASE, BLOOD: Lipase: 25 U/L (ref 11–51)

## 2016-07-12 MED ORDER — DIPHENOXYLATE-ATROPINE 2.5-0.025 MG PO TABS
2.0000 | ORAL_TABLET | Freq: Four times a day (QID) | ORAL | Status: DC | PRN
  Administered 2016-07-12: 2 via ORAL
  Filled 2016-07-12: qty 2

## 2016-07-12 MED ORDER — SODIUM CHLORIDE 0.9 % IV BOLUS (SEPSIS)
1000.0000 mL | Freq: Once | INTRAVENOUS | Status: AC
  Administered 2016-07-12: 1000 mL via INTRAVENOUS

## 2016-07-12 MED ORDER — DIPHENOXYLATE-ATROPINE 2.5-0.025 MG PO TABS: 1.0000 | ORAL_TABLET | Freq: Four times a day (QID) | ORAL | 0 refills | Status: DC | PRN

## 2016-07-12 NOTE — Discharge Instructions (Signed)
Medications: Lomotil  Treatment: Take Lomotil as prescribed 4 times a day as needed for your diarrhea. Do not exceed 4 times daily. Patient to drink plenty of water, at least 8 glasses of water daily. You will be called in 2-3 days if any of your results return positive. At that time, please see treatment at the health department if any of your results are positive.  Follow-up: Please follow-up and establish care with a primary care provider for follow-up of today's visit. Please return to emergency department if you develop any new or worsening symptoms or you continue to have diarrhea after 1 week of symptoms.

## 2016-07-12 NOTE — ED Notes (Signed)
PT reports 4 days of diarrhea that has a foul odor. Denies abdominal pain or tenderness on assessment. Pt reports he was worried about dehydration.

## 2016-07-12 NOTE — ED Provider Notes (Addendum)
[facility]-EMERGENCY DEPT Provider Note   CSN: [id number] Arrival date & time: 07/12/16  4098  First Provider Contact:  First MD Initiated Contact with Patient 07/12/16 1030        History   Chief Complaint Chief Complaint  Patient presents with  . Abdominal Pain    HPI [name] [name] is a 25 y.o. male who presents with a four-day history of diarrhea. Patient reports he has been using the restroom with watery, nonbloody diarrhea every 30 minutes. Patient had one episode of nausea and vomiting as 4 days ago. Patient reports mild nausea right now, but no vomiting. Patient denies any abdominal pain. Patient isn't taking any medications at home. Patient reports that when he gave a urine sample today, he felt some burning on urination. He denies any penile discharge or pain. He does express some concern that it may be an STD. Patient denies any chest pain, shortness of breath, fevers, headaches. Patient reports that he smokes cigarettes and occasionally marijuana. Patient denies any recent antibiotic treatment or eating any food out of the ordinary.  HPI  Past Medical History:  Diagnosis Date  . Diabetes (HCC)   . Heart defect   . Stroke [facility])     Patient Active Problem List   Diagnosis Date Noted  . Stroke (HCC)   . Heart defect   . Diabetes (HCC)   . TRANSIENT ISCHEMIC ATTACK 04/15/2008  . 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 INSOMNIA 03/31/2008  . ACNE 10/19/2007  . ATTENTION DEFICIT, W/HYPERACTIVITY 02/02/2007    Past Surgical History:  Procedure Laterality Date  . CARDIAC SURGERY         Home Medications    Prior to Admission medications   Medication Sig Start Date End Date Taking? Authorizing Provider  amoxicillin-clavulanate (AUGMENTIN) 875-125 MG tablet Take 1 tablet by mouth 2 (two) times daily. Patient not taking: Reported on 07/12/2016 09/06/15   Lemont Fillers Kirichenko, PA-C  diphenhydrAMINE (BENADRYL) 25 MG tablet Take 1 tablet (25 mg total) by mouth  every 6 (six) hours. Patient not taking: Reported on 07/12/2016 11/17/15   Catha Gosselin, PA-C  diphenoxylate-atropine (LOMOTIL) 2.5-0.025 MG tablet Take 1 tablet by mouth 4 (four) times daily as needed for diarrhea or loose stools. 07/12/16   Emi Holes, PA-C  doxycycline (VIBRAMYCIN) 100 MG capsule Take 1 capsule (100 mg total) by mouth 2 (two) times daily. One po bid x 7 days Patient not taking: Reported on 07/12/2016 09/30/14   Raymon Mutton, PA-C    Family History No family history on file.  Social History Social History  Substance Use Topics  . Smoking status: Current Every Day Smoker    Packs/day: 0.50    Types: Cigarettes  . Smokeless tobacco: Not on file  . Alcohol use No     Allergies   Review of patient's allergies indicates no known allergies.   Review of Systems Review of Systems  Constitutional: Negative for chills and fever.  HENT: Negative for facial swelling and sore throat.   Respiratory: Negative for shortness of breath.   Cardiovascular: Negative for chest pain.  Gastrointestinal: Negative for abdominal pain, nausea and vomiting.  Genitourinary: Positive for dysuria. Negative for discharge and penile pain.  Musculoskeletal: Negative for back pain.  Skin: Negative for rash and wound.  Neurological: Negative for headaches.  Psychiatric/Behavioral: The patient is not nervous/anxious.      Physical Exam Updated Vital Signs BP 106/59   Pulse (!) 52   Temp 98.3 F (  36.8 C) (Oral)   Resp 16   Ht 6' (1.829 m)   Wt 77.1 kg   SpO2 100%   BMI 23.06 kg/m   Physical Exam  Constitutional: He appears well-developed and well-nourished. No distress.  HENT:  Head: Normocephalic and atraumatic.  Mouth/Throat: Oropharynx is clear and moist. No oropharyngeal exudate.  Eyes: Conjunctivae are normal. Pupils are equal, round, and reactive to light. Right eye exhibits no discharge. Left eye exhibits no discharge. No scleral icterus.  Neck: Normal range of  motion. Neck supple. No thyromegaly present.  Cardiovascular: Normal rate, regular rhythm, normal heart sounds and intact distal pulses.  Exam reveals no gallop and no friction rub.   No murmur heard. Pulmonary/Chest: Effort normal and breath sounds normal. No stridor. No respiratory distress. He has no wheezes. He has no rales.  Abdominal: Soft. Bowel sounds are normal. He exhibits no distension. There is no tenderness. There is no rebound and no guarding.  Genitourinary: Rectum normal. Rectal exam shows no mass and guaiac negative stool.  Musculoskeletal: He exhibits no edema.  Lymphadenopathy:    He has no cervical adenopathy.  Neurological: He is alert. Coordination normal.  Skin: Skin is warm and dry. No rash noted. He is not diaphoretic. No pallor.  Psychiatric: He has a normal mood and affect.  Nursing note and vitals reviewed.    ED Treatments / Results  Labs (all labs ordered are listed, but only abnormal results are displayed) Labs Reviewed  URINALYSIS, ROUTINE W REFLEX MICROSCOPIC (NOT AT [facility]) - Abnormal; Notable for the following:       Result Value   APPearance HAZY (*)    Specific Gravity, Urine 1.034 (*)    Ketones, ur 15 (*)    All other components within normal limits  URINE CULTURE  LIPASE, BLOOD  COMPREHENSIVE METABOLIC PANEL  CBC  RPR  HIV ANTIBODY (ROUTINE TESTING)  POC OCCULT BLOOD, ED  GC/CHLAMYDIA PROBE AMP ([place]) NOT AT [facility]    EKG  EKG Interpretation None       Radiology No results found.  Procedures Procedures (including critical care time)  Medications Ordered in ED Medications  diphenoxylate-atropine (LOMOTIL) 2.5-0.025 MG per tablet 2 tablet (2 tablets Oral Given 07/12/16 1159)  sodium chloride 0.9 % bolus 1,000 mL (0 mLs Intravenous Stopped 07/12/16 1226)     Initial Impression / Assessment and Plan / ED Course  I have reviewed the triage vital signs and the nursing notes.  Pertinent labs & imaging results that were  available during my care of the patient were reviewed by me and considered in my medical decision making (see chart for details).  Clinical Course    1 L NS bolus and Lomotil given in ED. Rectal exam performed in ED and tolerated well, fecal occult negative. Benign abdominal exam. CBC, CMP unremarkable. UA shows elevated specific gravity on the 15 ketones, suggesting dehydration. Most likely cause of patient's dysuria. GC/chlamydia urine pending. HIV, RPR pending. Urine culture sent and would treat if culture positive. Fecal occult negative. Patient does not have concern of STI exposure at this time, dysuria most likely due to to dehydration. Patient is concerned that his previous syphilis is resolved. Patient decided to wait for GC/chlamydia treatment once cultures are returned. Patient without penile discharge or pain at this time. Patient will be discharged home with Lomotil and told to increase his at home fluid intake. Patient advised he would be called in 2-3 days if any of his results return positive.  Strict return precautions given. Patient lies to follow up and establish care with a primary care provider. Patient understands and agrees with plan. I discussed patient with Dr. Fayrene Fearing who is in agreement with plan. Patient vitals stable throughout ED course discharge and discharged in satisfactory condition.  Final Clinical Impressions(s) / ED Diagnoses   Final diagnoses:  Diarrhea, unspecified type     New Prescriptions New Prescriptions   DIPHENOXYLATE-ATROPINE (LOMOTIL) 2.5-0.025 MG TABLET    Take 1 tablet by mouth 4 (four) times daily as needed for diarrhea or loose stools.     Emi Holes, PA-C 07/12/16 1242    Rolland Porter, MD 07/20/16 1107    Emi Holes, PA-C 09/22/16 1641    Rolland Porter, MD 10/04/16 2113

## 2016-07-13 LAB — RPR: RPR Ser Ql: NONREACTIVE

## 2016-07-13 LAB — GC/CHLAMYDIA PROBE AMP (~~LOC~~) NOT AT ARMC
Chlamydia: NEGATIVE
Neisseria Gonorrhea: NEGATIVE

## 2016-07-13 LAB — HIV ANTIBODY (ROUTINE TESTING W REFLEX): HIV Screen 4th Generation wRfx: NONREACTIVE

## 2016-07-14 LAB — URINE CULTURE: Culture: NO GROWTH

## 2018-02-08 IMAGING — DX DG CHEST 2V
2 series · 2 of 2 positions shown · non-contrast
Comparison: 11/11/2015 and prior chest radiographs

CLINICAL DATA: 24-year-old male with acute chest pain.

EXAM:
CHEST  2 VIEW

[w chest pa]
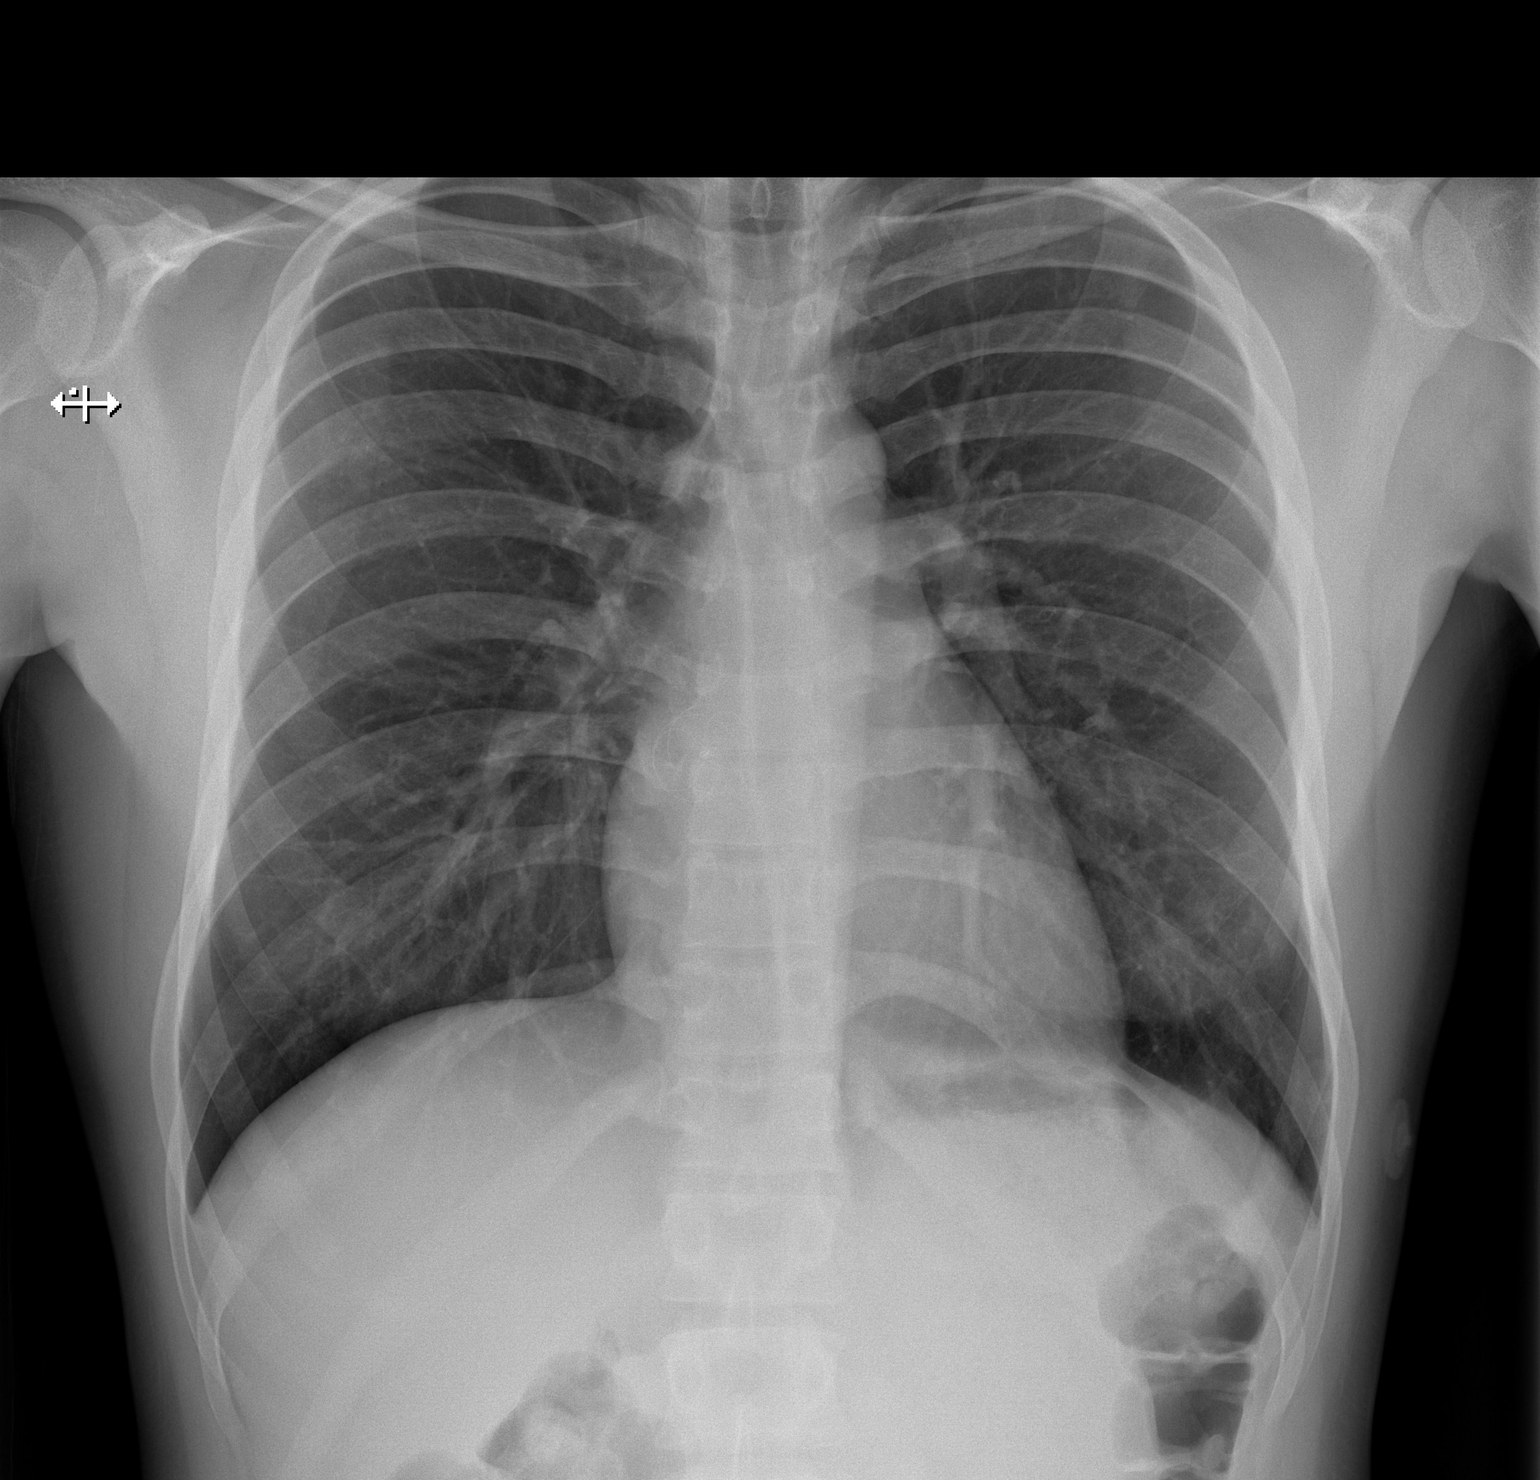

[w chest lat]
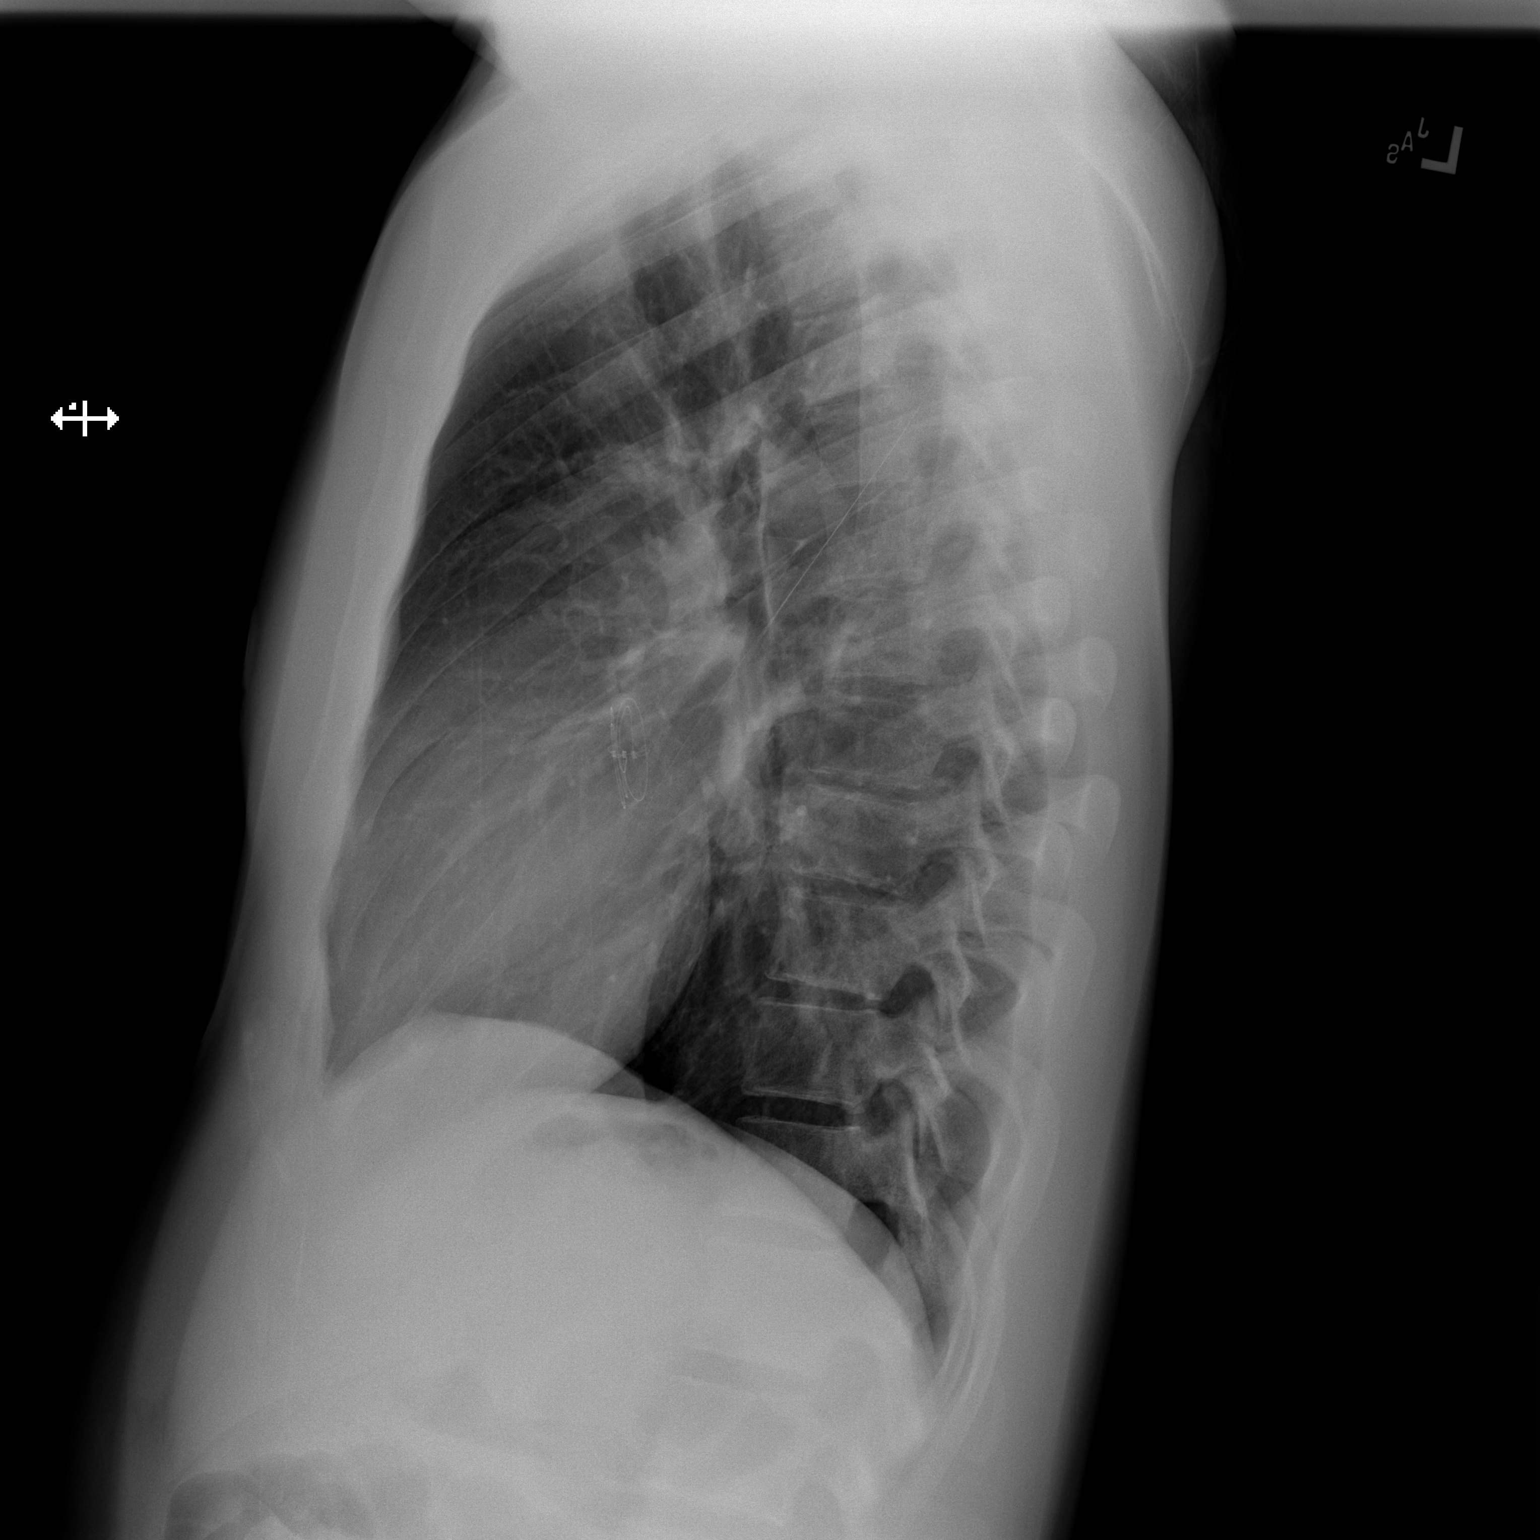

[2 of 2 positions shown; findings below may reference images not displayed]

FINDINGS: The cardiomediastinal silhouette is unremarkable.

Atrial septal device again noted.

There is no evidence of focal airspace disease, pulmonary edema,
suspicious pulmonary nodule/mass, pleural effusion, or pneumothorax.
No acute bony abnormalities are identified.
IMPRESSION: No active cardiopulmonary disease.

## 2018-10-03 ENCOUNTER — Other Ambulatory Visit: Payer: Self-pay

## 2018-10-03 ENCOUNTER — Encounter (HOSPITAL_COMMUNITY): Payer: Self-pay | Admitting: Emergency Medicine

## 2018-10-03 ENCOUNTER — Emergency Department (HOSPITAL_COMMUNITY)
Admission: EM | Admit: 2018-10-03 | Discharge: 2018-10-04 | Disposition: A | Discharge status: Home / Self Care | Payer: Self-pay | Attending: Emergency Medicine | Admitting: Emergency Medicine

## 2018-10-03 DIAGNOSIS — Z79899 Other long term (current) drug therapy: Secondary | ICD-10-CM | POA: Insufficient documentation

## 2018-10-03 DIAGNOSIS — Y929 Unspecified place or not applicable: Secondary | ICD-10-CM | POA: Insufficient documentation

## 2018-10-03 DIAGNOSIS — Y9389 Activity, other specified: Secondary | ICD-10-CM | POA: Insufficient documentation

## 2018-10-03 DIAGNOSIS — Y999 Unspecified external cause status: Secondary | ICD-10-CM | POA: Insufficient documentation

## 2018-10-03 DIAGNOSIS — S61412A Laceration without foreign body of left hand, initial encounter: Secondary | ICD-10-CM | POA: Insufficient documentation

## 2018-10-03 DIAGNOSIS — Z8673 Personal history of transient ischemic attack (TIA), and cerebral infarction without residual deficits: Secondary | ICD-10-CM | POA: Insufficient documentation

## 2018-10-03 DIAGNOSIS — W260XXA Contact with knife, initial encounter: Secondary | ICD-10-CM | POA: Insufficient documentation

## 2018-10-03 DIAGNOSIS — E119 Type 2 diabetes mellitus without complications: Secondary | ICD-10-CM | POA: Insufficient documentation

## 2018-10-03 DIAGNOSIS — F17210 Nicotine dependence, cigarettes, uncomplicated: Secondary | ICD-10-CM | POA: Insufficient documentation

## 2018-10-03 MED ORDER — LIDOCAINE HCL (PF) 1 % IJ SOLN
5.0000 mL | Freq: Once | INTRAMUSCULAR | Status: AC
  Administered 2018-10-03: 5 mL
  Filled 2018-10-03: qty 5

## 2018-10-03 NOTE — ED Provider Notes (Signed)
[name] EMERGENCY DEPARTMENT Provider Note   CSN: [id number] Arrival date & time: 10/03/18  2210     History   Chief Complaint Chief Complaint  Patient presents with  . Extremity Laceration    HPI [name] [name] is a 27 y.o. male.  Patient states he was attempting to sharpen a knife, and cut his left hand. He has a 2 cm laceration to the palmer surface of the inner aspect of the palm overlying the base of the 5th metacarpal. Tetanus is up to date.  The history is provided by the patient. No language interpreter was used.  Laceration   The incident occurred 3 to 5 hours ago. The laceration is located on the left hand. The laceration is 2 cm in size. The laceration mechanism was a a clean knife. The pain is mild. He reports no foreign bodies present. His tetanus status is UTD.    Past Medical History:  Diagnosis Date  . Diabetes (HCC)   . Heart defect   . Stroke [facility])     Patient Active Problem List   Diagnosis Date Noted  . Stroke (HCC)   . Heart defect   . Diabetes (HCC)   . TRANSIENT ISCHEMIC ATTACK 04/15/2008  . 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 INSOMNIA 03/31/2008  . ACNE 10/19/2007  . ATTENTION DEFICIT, W/HYPERACTIVITY 02/02/2007    Past Surgical History:  Procedure Laterality Date  . CARDIAC SURGERY          Home Medications    Prior to Admission medications   Medication Sig Start Date End Date Taking? Authorizing Provider  amoxicillin-clavulanate (AUGMENTIN) 875-125 MG tablet Take 1 tablet by mouth 2 (two) times daily. Patient not taking: Reported on 07/12/2016 09/06/15   Jaynie Crumble, PA-C  diphenhydrAMINE (BENADRYL) 25 MG tablet Take 1 tablet (25 mg total) by mouth every 6 (six) hours. Patient not taking: Reported on 07/12/2016 11/17/15   Patel-Mills, Lorelle Formosa, PA-C  diphenoxylate-atropine (LOMOTIL) 2.5-0.025 MG tablet Take 1 tablet by mouth 4 (four) times daily as needed for diarrhea or loose stools.  07/12/16   Law, Waylan Boga, PA-C  doxycycline (VIBRAMYCIN) 100 MG capsule Take 1 capsule (100 mg total) by mouth 2 (two) times daily. One po bid x 7 days Patient not taking: Reported on 07/12/2016 09/30/14   Raymon Mutton, PA-C    Family History No family history on file.  Social History Social History   Tobacco Use  . Smoking status: Current Every Day Smoker    Packs/day: 0.50    Types: Cigarettes  . Smokeless tobacco: Never Used  Substance Use Topics  . Alcohol use: No  . Drug use: No     Allergies   Patient has no known allergies.   Review of Systems Review of Systems  Skin: Positive for wound.  All other systems reviewed and are negative.    Physical Exam Updated Vital Signs BP 110/77 (BP Location: Right Arm)   Pulse 63   Temp 99.3 F (37.4 C) (Oral)   Resp 16   Ht 6' (1.829 m)   Wt 89.4 kg   SpO2 99%   BMI 26.72 kg/m   Physical Exam  Constitutional: He is oriented to person, place, and time. He appears well-developed and well-nourished.  HENT:  Head: Atraumatic.  Eyes: EOM are normal.  Neck: Neck supple.  Cardiovascular: Normal rate and regular rhythm.  Pulmonary/Chest: Effort normal and breath sounds normal.  Abdominal: Soft. Bowel sounds are normal.  Musculoskeletal:  Normal range of motion.       Hands: Neurological: He is alert and oriented to person, place, and time.  Skin: Skin is warm and dry.  Psychiatric: He has a normal mood and affect.  Nursing note and vitals reviewed.    ED Treatments / Results  Labs (all labs ordered are listed, but only abnormal results are displayed) Labs Reviewed - No data to display  EKG None  Radiology No results found.  Procedures .Marland KitchenLaceration Repair Date/Time: 10/04/2018 12:17 AM Performed by: Felicie Morn, NP Authorized by: Felicie Morn, NP   Consent:    Consent obtained:  Verbal   Consent given by:  Patient   Risks discussed:  Infection, pain and poor cosmetic result   Alternatives  discussed:  No treatment Anesthesia (see MAR for exact dosages):    Anesthesia method:  Local infiltration   Local anesthetic:  Lidocaine 1% w/o epi Laceration details:    Location:  Hand   Hand location:  L palm   Length (cm):  2.1 Repair type:    Repair type:  Simple Pre-procedure details:    Preparation:  Patient was prepped and draped in usual sterile fashion Exploration:    Hemostasis achieved with:  Direct pressure   Wound exploration: entire depth of wound probed and visualized     Contaminated: no   Treatment:    Area cleansed with:  Betadine and saline   Amount of cleaning:  Standard   Irrigation solution:  Tap water and sterile saline   Irrigation method:  Syringe Skin repair:    Repair method:  Sutures   Suture size:  4-0   Suture material:  Prolene   Suture technique:  Simple interrupted   Number of sutures:  5 Post-procedure details:    Dressing:  Sterile dressing   Patient tolerance of procedure:  Tolerated well, no immediate complications   (including critical care time)  Medications Ordered in ED Medications - No data to display   Initial Impression / Assessment and Plan / ED Course  I have reviewed the triage vital signs and the nursing notes.  Pertinent labs & imaging results that were available during my care of the patient were reviewed by me and considered in my medical decision making (see chart for details).     Tetanus UTD. Laceration occurred < 12 hours prior to repair. Discussed laceration care with pt and answered questions. Pt to f-u for suture removal in 7-10 days and wound check sooner should there be signs of dehiscence or infection. Pt is hemodynamically stable with no complaints prior to dc.    Final Clinical Impressions(s) / ED Diagnoses   Final diagnoses:  Laceration of left hand without foreign body, initial encounter    ED Discharge Orders    None       Felicie Morn, NP 10/04/18 0019    Dione Booze, MD 10/05/18  [phone]

## 2018-10-03 NOTE — ED Triage Notes (Signed)
Pt reports knife sharpening tonight when his hand slipped and cut the bottom left of palm. About 1 inch lac. Bleeding controlled.

## 2018-10-04 NOTE — Discharge Instructions (Addendum)
Suture removal in 7-10 days 

## 2018-10-23 ENCOUNTER — Encounter (HOSPITAL_COMMUNITY): Payer: Self-pay

## 2018-10-23 ENCOUNTER — Emergency Department (HOSPITAL_COMMUNITY)
Admission: EM | Admit: 2018-10-23 | Discharge: 2018-10-23 | Disposition: A | Discharge status: Home / Self Care | Payer: Self-pay | Attending: Emergency Medicine | Admitting: Emergency Medicine

## 2018-10-23 DIAGNOSIS — Z79899 Other long term (current) drug therapy: Secondary | ICD-10-CM | POA: Insufficient documentation

## 2018-10-23 DIAGNOSIS — Z8673 Personal history of transient ischemic attack (TIA), and cerebral infarction without residual deficits: Secondary | ICD-10-CM | POA: Insufficient documentation

## 2018-10-23 DIAGNOSIS — E119 Type 2 diabetes mellitus without complications: Secondary | ICD-10-CM | POA: Insufficient documentation

## 2018-10-23 DIAGNOSIS — N342 Other urethritis: Secondary | ICD-10-CM | POA: Insufficient documentation

## 2018-10-23 DIAGNOSIS — F17210 Nicotine dependence, cigarettes, uncomplicated: Secondary | ICD-10-CM | POA: Insufficient documentation

## 2018-10-23 MED ORDER — CEFTRIAXONE SODIUM 250 MG IJ SOLR
250.0000 mg | Freq: Once | INTRAMUSCULAR | Status: AC
  Administered 2018-10-23: 250 mg via INTRAMUSCULAR
  Filled 2018-10-23: qty 250

## 2018-10-23 MED ORDER — AZITHROMYCIN 250 MG PO TABS
1000.0000 mg | ORAL_TABLET | Freq: Once | ORAL | Status: AC
  Administered 2018-10-23: 1000 mg via ORAL
  Filled 2018-10-23: qty 4

## 2018-10-23 NOTE — ED Notes (Signed)
Pt stable, ambulatory, states understanding of discharge instructions 

## 2018-10-23 NOTE — ED Provider Notes (Signed)
[name] EMERGENCY DEPARTMENT Provider Note   CSN: [id number] Arrival date & time: 10/23/18  1515     History   Chief Complaint Chief Complaint  Patient presents with  . Groin Pain    HPI [name] [name] is a 27 y.o. male.  The history is provided by the patient. No language interpreter was used.  Groin Pain  This is a new problem. The current episode started 2 days ago. The problem occurs constantly. The problem has been gradually worsening. Nothing aggravates the symptoms. Nothing relieves the symptoms. He has tried nothing for the symptoms. The treatment provided no relief.   Pt reports his penis is swollen.  Pt reports he has swelling and pain in bilat groin areas.  Pt reports thick discharge.  Past Medical History:  Diagnosis Date  . Diabetes (HCC)   . Heart defect   . Stroke [facility])     Patient Active Problem List   Diagnosis Date Noted  . Stroke (HCC)   . Heart defect   . Diabetes (HCC)   . TRANSIENT ISCHEMIC ATTACK 04/15/2008  . 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 INSOMNIA 03/31/2008  . ACNE 10/19/2007  . ATTENTION DEFICIT, W/HYPERACTIVITY 02/02/2007    Past Surgical History:  Procedure Laterality Date  . CARDIAC SURGERY          Home Medications    Prior to Admission medications   Medication Sig Start Date End Date Taking? Authorizing Provider  amoxicillin-clavulanate (AUGMENTIN) 875-125 MG tablet Take 1 tablet by mouth 2 (two) times daily. Patient not taking: Reported on 07/12/2016 09/06/15   Jaynie Crumble, PA-C  diphenhydrAMINE (BENADRYL) 25 MG tablet Take 1 tablet (25 mg total) by mouth every 6 (six) hours. Patient not taking: Reported on 07/12/2016 11/17/15   Patel-Mills, Lorelle Formosa, PA-C  diphenoxylate-atropine (LOMOTIL) 2.5-0.025 MG tablet Take 1 tablet by mouth 4 (four) times daily as needed for diarrhea or loose stools. 07/12/16   Law, Waylan Boga, PA-C  doxycycline (VIBRAMYCIN) 100 MG capsule Take 1 capsule  (100 mg total) by mouth 2 (two) times daily. One po bid x 7 days Patient not taking: Reported on 07/12/2016 09/30/14   Raymon Mutton, PA-C    Family History History reviewed. No pertinent family history.  Social History Social History   Tobacco Use  . Smoking status: Current Every Day Smoker    Packs/day: 0.50    Types: Cigarettes  . Smokeless tobacco: Never Used  Substance Use Topics  . Alcohol use: No  . Drug use: No     Allergies   Patient has no known allergies.   Review of Systems Review of Systems  All other systems reviewed and are negative.    Physical Exam Updated Vital Signs Pulse 82   Temp 97.9 F (36.6 C) (Oral)   Resp 16   SpO2 100%   Physical Exam  Constitutional: He is oriented to person, place, and time. He appears well-developed and well-nourished.  HENT:  Head: Normocephalic.  Eyes: EOM are normal.  Neck: Normal range of motion.  Pulmonary/Chest: Effort normal.  Abdominal: He exhibits no distension.  Genitourinary: Penile tenderness present.  Genitourinary Comments: Caked white discharge   Musculoskeletal: Normal range of motion.  Neurological: He is alert and oriented to person, place, and time.  Psychiatric: He has a normal mood and affect.  Nursing note and vitals reviewed.    ED Treatments / Results  Labs (all labs ordered are listed, but only abnormal results are displayed)  Labs Reviewed  GC/CHLAMYDIA PROBE AMP ([place]) NOT AT [facility]    EKG None  Radiology No results found.  Procedures Procedures (including critical care time)  Medications Ordered in ED Medications  cefTRIAXone (ROCEPHIN) injection 250 mg (has no administration in time range)  azithromycin (ZITHROMAX) tablet 1,000 mg (has no administration in time range)     Initial Impression / Assessment and Plan / ED Course  I have reviewed the triage vital signs and the nursing notes.  Pertinent labs & imaging results that were available during my care of  the patient were reviewed by me and considered in my medical decision making (see chart for details).     Pt unable to tolerate gc ct probe.  UA sent for gc and ct..   Pt given Rocephin and zithromax.     Final Clinical Impressions(s) / ED Diagnoses   Final diagnoses:  Urethritis    ED Discharge Orders    None     Meds ordered this encounter  Medications  . cefTRIAXone (ROCEPHIN) injection 250 mg    Order Specific Question:   Antibiotic Indication:    Answer:   STD  . azithromycin (ZITHROMAX) tablet 1,000 mg   An After Visit Summary was printed and given to the patient.    Osie CheeksSofia, Terance Pomplun K, PA-C 10/23/18 1537    Eber HongMiller, Brian, MD 10/24/18 [phone]

## 2018-10-23 NOTE — ED Triage Notes (Signed)
Pt c/o swelling at the top of penis and bilateral groin pain since yesterday

## 2018-10-24 LAB — GC/CHLAMYDIA PROBE AMP (~~LOC~~) NOT AT ARMC
Chlamydia: NEGATIVE
NEISSERIA GONORRHEA: POSITIVE — AB

## 2018-12-24 ENCOUNTER — Emergency Department (HOSPITAL_COMMUNITY): Payer: Medicaid Other

## 2018-12-24 ENCOUNTER — Other Ambulatory Visit: Payer: Self-pay

## 2018-12-24 ENCOUNTER — Emergency Department (HOSPITAL_COMMUNITY)
Admission: EM | Admit: 2018-12-24 | Discharge: 2018-12-24 | Disposition: A | Discharge status: Home / Self Care | Payer: Medicaid Other | Attending: Emergency Medicine | Admitting: Emergency Medicine

## 2018-12-24 ENCOUNTER — Encounter (HOSPITAL_COMMUNITY): Payer: Self-pay | Admitting: Emergency Medicine

## 2018-12-24 DIAGNOSIS — E119 Type 2 diabetes mellitus without complications: Secondary | ICD-10-CM | POA: Insufficient documentation

## 2018-12-24 DIAGNOSIS — Z8673 Personal history of transient ischemic attack (TIA), and cerebral infarction without residual deficits: Secondary | ICD-10-CM | POA: Insufficient documentation

## 2018-12-24 DIAGNOSIS — Y929 Unspecified place or not applicable: Secondary | ICD-10-CM | POA: Insufficient documentation

## 2018-12-24 DIAGNOSIS — Y999 Unspecified external cause status: Secondary | ICD-10-CM | POA: Insufficient documentation

## 2018-12-24 DIAGNOSIS — F17210 Nicotine dependence, cigarettes, uncomplicated: Secondary | ICD-10-CM | POA: Insufficient documentation

## 2018-12-24 DIAGNOSIS — Y939 Activity, unspecified: Secondary | ICD-10-CM | POA: Insufficient documentation

## 2018-12-24 DIAGNOSIS — S91031A Puncture wound without foreign body, right ankle, initial encounter: Secondary | ICD-10-CM | POA: Insufficient documentation

## 2018-12-24 DIAGNOSIS — W3400XA Accidental discharge from unspecified firearms or gun, initial encounter: Secondary | ICD-10-CM

## 2018-12-24 MED ORDER — TETANUS-DIPHTH-ACELL PERTUSSIS 5-2.5-18.5 LF-MCG/0.5 IM SUSP
0.5000 mL | Freq: Once | INTRAMUSCULAR | Status: DC
  Filled 2018-12-24: qty 0.5

## 2018-12-24 MED ORDER — IBUPROFEN 400 MG PO TABS
400.0000 mg | ORAL_TABLET | Freq: Once | ORAL | Status: AC
  Administered 2018-12-24: 400 mg via ORAL
  Filled 2018-12-24: qty 1

## 2018-12-24 NOTE — Discharge Instructions (Addendum)
Clean the wounds once a day and keep a dressing on it.  Take acetaminophen and/or ibuprofen as needed for pain.  Return if any signs of infection develop.

## 2018-12-24 NOTE — ED Triage Notes (Signed)
Pt presents with penetrating wound to right ankle. Swelling noted. Ambulatory without assistance.

## 2018-12-24 NOTE — ED Notes (Signed)
Pt discharged from ED; instructions provided and scripts given; Pt encouraged to return to ED if symptoms worsen and to f/u with PCP; Pt verbalized understanding of all instructions 
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
## 2019-01-01 ENCOUNTER — Emergency Department (HOSPITAL_COMMUNITY)
Admission: EM | Admit: 2019-01-01 | Discharge: 2019-01-02 | Disposition: A | Discharge status: Home / Self Care | Payer: Self-pay | Attending: Emergency Medicine | Admitting: Emergency Medicine

## 2019-01-01 ENCOUNTER — Other Ambulatory Visit: Payer: Self-pay

## 2019-01-01 DIAGNOSIS — Z5321 Procedure and treatment not carried out due to patient leaving prior to being seen by health care provider: Secondary | ICD-10-CM | POA: Insufficient documentation

## 2019-01-01 DIAGNOSIS — W3400XA Accidental discharge from unspecified firearms or gun, initial encounter: Secondary | ICD-10-CM

## 2019-01-01 DIAGNOSIS — S91001D Unspecified open wound, right ankle, subsequent encounter: Secondary | ICD-10-CM | POA: Insufficient documentation

## 2019-01-01 DIAGNOSIS — Y33XXXD Other specified events, undetermined intent, subsequent encounter: Secondary | ICD-10-CM | POA: Insufficient documentation

## 2019-01-01 NOTE — ED Triage Notes (Signed)
Pt here with penetrating wound to the right ankle.  Said swelling is worse than before.  A&Ox4, ambulatory to triage.

## 2019-01-02 ENCOUNTER — Emergency Department (HOSPITAL_COMMUNITY): Payer: Self-pay

## 2019-01-02 ENCOUNTER — Encounter (HOSPITAL_COMMUNITY): Payer: Self-pay

## 2019-01-02 NOTE — ED Notes (Signed)
Called pt x2 for vitals, no response. °

## 2019-01-02 NOTE — ED Notes (Signed)
Called pt x4 for vitals, no response. 

## 2019-02-13 ENCOUNTER — Other Ambulatory Visit: Payer: Self-pay

## 2019-02-13 ENCOUNTER — Emergency Department (HOSPITAL_COMMUNITY)
Admission: EM | Admit: 2019-02-13 | Discharge: 2019-02-13 | Disposition: A | Discharge status: Home / Self Care | Payer: Medicaid Other | Attending: Emergency Medicine | Admitting: Emergency Medicine

## 2019-02-13 ENCOUNTER — Encounter (HOSPITAL_COMMUNITY): Payer: Self-pay | Admitting: Emergency Medicine

## 2019-02-13 DIAGNOSIS — F17210 Nicotine dependence, cigarettes, uncomplicated: Secondary | ICD-10-CM | POA: Insufficient documentation

## 2019-02-13 DIAGNOSIS — E119 Type 2 diabetes mellitus without complications: Secondary | ICD-10-CM | POA: Insufficient documentation

## 2019-02-13 DIAGNOSIS — R590 Localized enlarged lymph nodes: Secondary | ICD-10-CM

## 2019-02-13 LAB — URINALYSIS, ROUTINE W REFLEX MICROSCOPIC
Bacteria, UA: NONE SEEN
Glucose, UA: NEGATIVE mg/dL
Hgb urine dipstick: NEGATIVE
Ketones, ur: 5 mg/dL — AB
Nitrite: NEGATIVE
Protein, ur: 30 mg/dL — AB
Specific Gravity, Urine: 1.029 (ref 1.005–1.030)
pH: 5 (ref 5.0–8.0)

## 2019-02-13 LAB — COMPREHENSIVE METABOLIC PANEL
ALT: 23 U/L (ref 0–44)
AST: 28 U/L (ref 15–41)
Albumin: 4.1 g/dL (ref 3.5–5.0)
Alkaline Phosphatase: 61 U/L (ref 38–126)
Anion gap: 9 (ref 5–15)
BUN: 9 mg/dL (ref 6–20)
CO2: 24 mmol/L (ref 22–32)
Calcium: 9.3 mg/dL (ref 8.9–10.3)
Chloride: 101 mmol/L (ref 98–111)
Creatinine, Ser: 1.1 mg/dL (ref 0.61–1.24)
GFR calc Af Amer: >60 mL/min (ref >60)
GFR calc non Af Amer: >60 mL/min (ref >60)
Glucose, Bld: 98 mg/dL (ref 70–99)
Potassium: 3.2 mmol/L — ABNORMAL LOW (ref 3.5–5.1)
Sodium: 134 mmol/L — ABNORMAL LOW (ref 135–145)
Total Bilirubin: 0.9 mg/dL (ref 0.3–1.2)
Total Protein: 6.9 g/dL (ref 6.5–8.1)

## 2019-02-13 LAB — CBC
HCT: 45.2 % (ref 39.0–52.0)
Hemoglobin: 14.9 g/dL (ref 13.0–17.0)
MCH: 30.3 pg (ref 26.0–34.0)
MCHC: 33 g/dL (ref 30.0–36.0)
MCV: 91.9 fL (ref 80.0–100.0)
Platelets: 209 10*3/uL (ref 150–400)
RBC: 4.92 MIL/uL (ref 4.22–5.81)
RDW: 12.4 % (ref 11.5–15.5)
WBC: 9.4 10*3/uL (ref 4.0–10.5)
nRBC: 0 % (ref 0.0–0.2)

## 2019-02-13 LAB — HIV ANTIBODY (ROUTINE TESTING W REFLEX): HIV Screen 4th Generation wRfx: NONREACTIVE

## 2019-02-13 LAB — RPR: RPR Ser Ql: NONREACTIVE

## 2019-02-13 LAB — LIPASE, BLOOD: Lipase: 27 U/L (ref 11–51)

## 2019-02-13 MED ORDER — LIDOCAINE HCL (PF) 1 % IJ SOLN
5.0000 mL | Freq: Once | INTRAMUSCULAR | Status: AC
  Administered 2019-02-13: 2 mL via INTRADERMAL
  Filled 2019-02-13: qty 5

## 2019-02-13 MED ORDER — LIDOCAINE HCL (PF) 1 % IJ SOLN: 2.0000 mL | Freq: Once | INTRAMUSCULAR | Status: DC

## 2019-02-13 MED ORDER — AZITHROMYCIN 250 MG PO TABS
1000.0000 mg | ORAL_TABLET | Freq: Once | ORAL | Status: AC
  Administered 2019-02-13: 1000 mg via ORAL
  Filled 2019-02-13: qty 4

## 2019-02-13 MED ORDER — CEFTRIAXONE SODIUM 250 MG IJ SOLR
250.0000 mg | Freq: Once | INTRAMUSCULAR | Status: AC
  Administered 2019-02-13: 250 mg via INTRAMUSCULAR
  Filled 2019-02-13: qty 250

## 2019-02-13 NOTE — Discharge Instructions (Addendum)
You were tested and treated for gonorrhea and chlamydia today.  If results are positive, you will receive a phone call.  You will not need further treatment, however you will need to let your partners know.  If results are negative, you will not receive a phone call.  Either way, you may check the results online on MyChart. Use Tylenol and ibuprofen as needed for pain. If the results are negative, and you are still having pain, follow-up with [place] [facility] listed below. Return to the emergency room with any new, worsening, concerning symptoms.

## 2019-02-13 NOTE — ED Triage Notes (Signed)
Pt reports groin pain for the past day. Pt describes the pain as sensitive.

## 2019-02-13 NOTE — ED Provider Notes (Signed)
[name] EMERGENCY DEPARTMENT Provider Note   CSN: [id number] Arrival date & time: 02/13/19  7544    History   Chief Complaint Chief Complaint  Patient presents with  . Groin Pain    HPI [name] [name] is a 28 y.o. male presenting for evaluation of right inguinal pain.  Patient states that the past day, he has had pain of his right inguinal region.  Patient states that the past 4 days, he has noted minimal clear discharge from his penis.  He denies penile or testicular pain.  He denies fevers, chills, nausea, vomiting, abdominal pain, dysuria, hematuria, urinary frequency, or abnormal bowel movements.  Patient states he is sexually active with 2 male partners, neither of whom have symptoms.  He does not use condoms.  He has been tested for STDs before in the past, and been positive and treated in the past.  He has not had any STD testing recently.  Patient states he takes no medications daily.  Additional history obtained from chart review.  Patient with a history of stroke, previous chlamydial infection, and diabetes.     HPI  Past Medical History:  Diagnosis Date  . Diabetes (HCC)   . Heart defect   . Stroke [facility])     Patient Active Problem List   Diagnosis Date Noted  . Stroke (HCC)   . Heart defect   . Diabetes (HCC)   . TRANSIENT ISCHEMIC ATTACK 04/15/2008  . 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 INSOMNIA 03/31/2008  . ACNE 10/19/2007  . ATTENTION DEFICIT, W/HYPERACTIVITY 02/02/2007    Past Surgical History:  Procedure Laterality Date  . CARDIAC SURGERY          Home Medications    Prior to Admission medications   Not on File    Family History No family history on file.  Social History Social History   Tobacco Use  . Smoking status: Current Every Day Smoker    Packs/day: 0.50    Types: Cigarettes  . Smokeless tobacco: Never Used  Substance Use Topics  . Alcohol use: No  . Drug use: Yes    Types:  Marijuana     Allergies   Patient has no known allergies.   Review of Systems Review of Systems  Constitutional: Negative for fever.  Gastrointestinal: Negative for abdominal pain, nausea and vomiting.  Genitourinary: Positive for discharge. Negative for dysuria, frequency, hematuria, penile pain, penile swelling, scrotal swelling and testicular pain.     Physical Exam Updated Vital Signs BP 108/68 (BP Location: Right Arm)   Pulse 72   Temp 98.6 F (37 C) (Oral)   Resp 18   Ht 6' (1.829 m)   Wt 81.6 kg   SpO2 100%   BMI 24.41 kg/m   Physical Exam Vitals signs and nursing note reviewed. Exam conducted with a chaperone present.  Constitutional:      General: He is not in acute distress.    Appearance: He is well-developed.     Comments: Appears nontoxic  HENT:     Head: Normocephalic and atraumatic.  Neck:     Musculoskeletal: Normal range of motion.  Pulmonary:     Effort: Pulmonary effort is normal.  Abdominal:     General: There is no distension.  Genitourinary:    Penis: Normal and circumcised.      Scrotum/Testes: Normal.     Epididymis:     Right: Normal.     Left: Normal.  Comments: bilateral inguinal lymphadenopathy, tender and larger in size on the right side.  No penile discharge noted.  No penile or testicular pain or swelling. Musculoskeletal: Normal range of motion.  Lymphadenopathy:     Lower Body: Right inguinal adenopathy present. Left inguinal adenopathy present.  Skin:    General: Skin is warm.     Findings: No rash.  Neurological:     Mental Status: He is alert and oriented to person, place, and time.      ED Treatments / Results  Labs (all labs ordered are listed, but only abnormal results are displayed) Labs Reviewed  COMPREHENSIVE METABOLIC PANEL - Abnormal; Notable for the following components:      Result Value   Sodium 134 (*)    Potassium 3.2 (*)    All other components within normal limits  URINALYSIS, ROUTINE W REFLEX  MICROSCOPIC - Abnormal; Notable for the following components:   Color, Urine AMBER (*)    APPearance HAZY (*)    Bilirubin Urine SMALL (*)    Ketones, ur 5 (*)    Protein, ur 30 (*)    Leukocytes,Ua TRACE (*)    All other components within normal limits  LIPASE, BLOOD  CBC  RPR  HIV ANTIBODY (ROUTINE TESTING W REFLEX)  GC/CHLAMYDIA PROBE AMP () NOT AT [facility]    EKG None  Radiology No results found.  Procedures Procedures (including critical care time)  Medications Ordered in ED Medications  cefTRIAXone (ROCEPHIN) injection 250 mg (250 mg Intramuscular Given 02/13/19 0832)  azithromycin (ZITHROMAX) tablet 1,000 mg (1,000 mg Oral Given 02/13/19 0831)  lidocaine (PF) (XYLOCAINE) 1 % injection 5 mL (2 mLs Intradermal Given 02/13/19 1740)     Initial Impression / Assessment and Plan / ED Course  I have reviewed the triage vital signs and the nursing notes.  Pertinent labs & imaging results that were available during my care of the patient were reviewed by me and considered in my medical decision making (see chart for details).        Patient presenting for evaluation of groin pain.  Physical exam shows patient who is afebrile and appears nontoxic.  GU exam with bilateral inguinal lymphadenopathy, worse on the right side.  Patient reporting new clear penile drainage.  As such, will treat for gonorrhea and chlamydia before results have returned.  No testicular pain or swelling, doubt torsion.  No hernia palpated.  Discussed findings and plan with patient.  Discussed follow-up with PCP if symptoms are not improving and results are negative.  At this time, patient appears safe for discharge.  Return precautions given.  Patient states he understands and agrees plan.   Final Clinical Impressions(s) / ED Diagnoses   Final diagnoses:  Inguinal lymphadenopathy    ED Discharge Orders    None       Alveria Apley, PA-C 02/13/19 8144    Raeford Razor, MD 02/14/19  1536

## 2019-02-14 LAB — GC/CHLAMYDIA PROBE AMP (~~LOC~~) NOT AT ARMC
CHLAMYDIA, DNA PROBE: POSITIVE — AB
NEISSERIA GONORRHEA: NEGATIVE

## 2019-04-19 ENCOUNTER — Emergency Department (HOSPITAL_COMMUNITY)
Admission: EM | Admit: 2019-04-19 | Discharge: 2019-04-19 | Disposition: A | Discharge status: Home / Self Care | Payer: Medicaid Other | Attending: Emergency Medicine | Admitting: Emergency Medicine

## 2019-04-19 ENCOUNTER — Other Ambulatory Visit: Payer: Self-pay

## 2019-04-19 ENCOUNTER — Encounter (HOSPITAL_COMMUNITY): Payer: Self-pay | Admitting: Emergency Medicine

## 2019-04-19 DIAGNOSIS — E119 Type 2 diabetes mellitus without complications: Secondary | ICD-10-CM | POA: Insufficient documentation

## 2019-04-19 DIAGNOSIS — F17210 Nicotine dependence, cigarettes, uncomplicated: Secondary | ICD-10-CM | POA: Insufficient documentation

## 2019-04-19 DIAGNOSIS — Z202 Contact with and (suspected) exposure to infections with a predominantly sexual mode of transmission: Secondary | ICD-10-CM | POA: Insufficient documentation

## 2019-04-19 DIAGNOSIS — Z8673 Personal history of transient ischemic attack (TIA), and cerebral infarction without residual deficits: Secondary | ICD-10-CM | POA: Insufficient documentation

## 2019-04-19 DIAGNOSIS — K0889 Other specified disorders of teeth and supporting structures: Secondary | ICD-10-CM

## 2019-04-19 LAB — COMPREHENSIVE METABOLIC PANEL
ALT: 16 U/L (ref 0–44)
AST: 22 U/L (ref 15–41)
Albumin: 4 g/dL (ref 3.5–5.0)
Alkaline Phosphatase: 53 U/L (ref 38–126)
Anion gap: 7 (ref 5–15)
BUN: 7 mg/dL (ref 6–20)
CO2: 25 mmol/L (ref 22–32)
Calcium: 9.1 mg/dL (ref 8.9–10.3)
Chloride: 107 mmol/L (ref 98–111)
Creatinine, Ser: 1.02 mg/dL (ref 0.61–1.24)
GFR calc Af Amer: >60 mL/min (ref >60)
GFR calc non Af Amer: >60 mL/min (ref >60)
Glucose, Bld: 98 mg/dL (ref 70–99)
Potassium: 3.7 mmol/L (ref 3.5–5.1)
Sodium: 139 mmol/L (ref 135–145)
Total Bilirubin: 0.8 mg/dL (ref 0.3–1.2)
Total Protein: 7.1 g/dL (ref 6.5–8.1)

## 2019-04-19 LAB — URINALYSIS, ROUTINE W REFLEX MICROSCOPIC
Bilirubin Urine: NEGATIVE
Glucose, UA: NEGATIVE mg/dL
Hgb urine dipstick: NEGATIVE
Ketones, ur: NEGATIVE mg/dL
Leukocytes,Ua: NEGATIVE
Nitrite: NEGATIVE
Protein, ur: NEGATIVE mg/dL
Specific Gravity, Urine: 1.017 (ref 1.005–1.030)
pH: 6 (ref 5.0–8.0)

## 2019-04-19 LAB — RAPID HIV SCREEN (HIV 1/2 AB+AG)
HIV 1/2 Antibodies: NONREACTIVE
HIV-1 P24 Antigen - HIV24: NONREACTIVE

## 2019-04-19 LAB — CBC
HCT: 40.3 % (ref 39.0–52.0)
Hemoglobin: 13.7 g/dL (ref 13.0–17.0)
MCH: 30.8 pg (ref 26.0–34.0)
MCHC: 34 g/dL (ref 30.0–36.0)
MCV: 90.6 fL (ref 80.0–100.0)
Platelets: 197 10*3/uL (ref 150–400)
RBC: 4.45 MIL/uL (ref 4.22–5.81)
RDW: 12.9 % (ref 11.5–15.5)
WBC: 10.9 10*3/uL — ABNORMAL HIGH (ref 4.0–10.5)
nRBC: 0 % (ref 0.0–0.2)

## 2019-04-19 LAB — LIPASE, BLOOD: Lipase: 27 U/L (ref 11–51)

## 2019-04-19 MED ORDER — CEFTRIAXONE SODIUM 250 MG IJ SOLR
250.0000 mg | Freq: Once | INTRAMUSCULAR | Status: AC
  Administered 2019-04-19: 250 mg via INTRAMUSCULAR
  Filled 2019-04-19: qty 250

## 2019-04-19 MED ORDER — AZITHROMYCIN 1 G PO PACK
1.0000 g | PACK | Freq: Once | ORAL | Status: AC
  Administered 2019-04-19: 19:00:00 1 g via ORAL
  Filled 2019-04-19: qty 1

## 2019-04-19 MED ORDER — AMOXICILLIN-POT CLAVULANATE 875-125 MG PO TABS: 1.0000 | ORAL_TABLET | Freq: Two times a day (BID) | ORAL | 0 refills | Status: AC

## 2019-04-19 NOTE — ED Triage Notes (Signed)
Pt in with c/o sharp bilateral low abdominal pain x 5 days. C/o some nausea. Pt c/o dental pain as well, mostly upper L molar.

## 2019-04-19 NOTE — ED Provider Notes (Signed)
[name] EMERGENCY DEPARTMENT Provider Note   CSN: [id number] Arrival date & time: 04/19/19  1713    History   Chief Complaint Chief Complaint  Patient presents with  . Dental Pain  . Abdominal Pain    HPI [name] [name] is a 28 y.o. male.     HPI   Pt is a 28 y/o male with a h/o CVA who presents to the ED today for eval of mult complaints including dental pain and abd pain.   Dental pain: Pt c/o left upper dental pain x3 days. Pain is constant. Pain is severe. Has tried taking his aunts penicillin without relief of sxs. He has also taken OTC pain meds. Does not know how many pills he has taken. He brought a bottle of pills that contain pen vk and another blue tablet that he states is a pain medicine.   Lower abd pain: States that he has had lower abd pain for the last week. Located to bilat inguinal regions. Pain has been intermittent. States that it improves after intercourse Rates pain as 4/10. No fevers, vomiting, diarrhea, or constipation. He denies dysuria, but reports urgency and frequency. Denies penile discharge.  He states that his pain feels consistent with when he was dx with an STI in the past. He reports unprotected intercourse with one partner.   Past Medical History:  Diagnosis Date  . Diabetes (HCC)   . Heart defect   . Stroke [facility])     Patient Active Problem List   Diagnosis Date Noted  . Stroke (HCC)   . Heart defect   . Diabetes (HCC)   . TRANSIENT ISCHEMIC ATTACK 04/15/2008  . 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 INSOMNIA 03/31/2008  . ACNE 10/19/2007  . ATTENTION DEFICIT, W/HYPERACTIVITY 02/02/2007    Past Surgical History:  Procedure Laterality Date  . CARDIAC SURGERY          Home Medications    Prior to Admission medications   Medication Sig Start Date End Date Taking? Authorizing Provider  amoxicillin-clavulanate (AUGMENTIN) 875-125 MG tablet Take 1 tablet by mouth every 12 (twelve) hours for 7  days. 04/19/19 04/26/19  Tearah Saulsbury S, PA-C    Family History No family history on file.  Social History Social History   Tobacco Use  . Smoking status: Current Every Day Smoker    Packs/day: 0.50    Types: Cigarettes  . Smokeless tobacco: Never Used  Substance Use Topics  . Alcohol use: No  . Drug use: Yes    Types: Marijuana     Allergies   Patient has no known allergies.   Review of Systems Review of Systems  Constitutional: Negative for fever.  HENT: Positive for dental problem. Negative for ear pain and sore throat.   Eyes: Negative for visual disturbance.  Respiratory: Negative for shortness of breath.   Cardiovascular: Negative for chest pain.  Gastrointestinal: Positive for abdominal pain. Negative for vomiting.  Genitourinary: Positive for frequency and urgency. Negative for dysuria, hematuria, penile swelling, scrotal swelling and testicular pain.  Musculoskeletal: Negative for back pain.  Skin: Negative for rash.  All other systems reviewed and are negative.    Physical Exam Updated Vital Signs BP (!) 106/53 (BP Location: Right Arm)   Pulse 61   Temp 99.1 F (37.3 C) (Oral)   Resp 16   Wt 86.2 kg   SpO2 99%   BMI 25.77 kg/m   Physical Exam Vitals signs and nursing note  reviewed.  Constitutional:      Appearance: He is well-developed.  HENT:     Head: Normocephalic and atraumatic.     Mouth/Throat:     Comments: Multiple fillings present.  Tooth #14 appears to have dental carry it is tender to percussion.  There is no periapical abscess.  No trismus.  No sublingual swelling or submandibular swelling. Eyes:     Conjunctiva/sclera: Conjunctivae normal.  Neck:     Musculoskeletal: Neck supple.  Cardiovascular:     Rate and Rhythm: Normal rate and regular rhythm.     Pulses: Normal pulses.     Heart sounds: Normal heart sounds. No murmur.  Pulmonary:     Effort: Pulmonary effort is normal. No respiratory distress.     Breath sounds:  Normal breath sounds.  Abdominal:     General: Bowel sounds are normal. There is no distension.     Palpations: Abdomen is soft.     Comments: Mild tenderness to the bilateral inguinal areas with shotty lymph nodes bilaterally.  No inguinal hernia present.  No rebound tenderness guarding or rigidity.  Genitourinary:    Comments: Chaperone present.  Normal uncircumcised genitalia.  No lesions present GC chlamydia swab obtained. Skin:    General: Skin is warm and dry.  Neurological:     Mental Status: He is alert.      ED Treatments / Results  Labs (all labs ordered are listed, but only abnormal results are displayed) Labs Reviewed  CBC - Abnormal; Notable for the following components:      Result Value   WBC 10.9 (*)    All other components within normal limits  LIPASE, BLOOD  COMPREHENSIVE METABOLIC PANEL  URINALYSIS, ROUTINE W REFLEX MICROSCOPIC  RAPID HIV SCREEN (HIV 1/2 AB+AG)  RPR  GC/CHLAMYDIA PROBE AMP ([place]) NOT AT [facility]    EKG None  Radiology No results found.  Procedures Procedures (including critical care time)  Medications Ordered in ED Medications  cefTRIAXone (ROCEPHIN) injection 250 mg (has no administration in time range)  azithromycin (ZITHROMAX) powder 1 g (has no administration in time range)     Initial Impression / Assessment and Plan / ED Course  I have reviewed the triage vital signs and the nursing notes.  Pertinent labs & imaging results that were available during my care of the patient were reviewed by me and considered in my medical decision making (see chart for details).     Final Clinical Impressions(s) / ED Diagnoses   Final diagnoses:  Pain, dental  Possible exposure to STD   Patient today for evaluation of dental pain and bilateral inguinal pain with concern for sexually transmitted infection.  Dental pain: Patient with dental caries on exam.  Tender to percussion to tooth #14.  No evidence of gross abscess.  No  evidence of Ludwig angina.  Will treat with Augmentin and advised over-the-counter Tylenol Motrin for pain.  Dental resources provided.  Advised to follow-up for new or worsening symptoms.  Abdominal pain: Patient's abdominal pain seems to be related to swollen lymph nodes in the inguinal region bilaterally.  No evidence of hernia.  No actual abdominal tenderness.  No rigidity rebound or guarding present.  Nonsurgical abdomen.  He states symptoms feel exactly the same as when he had a sexually transmitted infection in the past.  He is also having some urinary frequency suggestive of urethritis.  Chlamydia swab obtained.  UA without evidence of urinary tract infection.  Only rare bacteria noted on microscopic  analysis.  CBC with very mild leukocytosis, otherwise labs are reassuring.  HIV/RPR obtained.  Advised on safe sex practices.  Advised return to ER for new or worsening symptoms.  He voiced understanding of plan reasons return.  All questions answered.  Patient stable discharge.  ED Discharge Orders         Ordered    amoxicillin-clavulanate (AUGMENTIN) 875-125 MG tablet  Every 12 hours     04/19/19 1827           Karrie MeresCouture, Dorean Daniello S, PA-C 04/19/19 1845    Loren RacerYelverton, David, MD 04/19/19 2111

## 2019-04-19 NOTE — ED Notes (Signed)
Patient verbalizes understanding of discharge instructions. Opportunity for questioning and answers were provided. Armband removed by staff, pt discharged from ED. Ambulated out to lobby  

## 2019-04-19 NOTE — Discharge Instructions (Addendum)
You have been tested for HIV, syphilis, chlamydia and gonorrhea.  These results will be available in approximately 3 days and you will be contacted by the hospital if the results are positive. Avoid sexual contact until you are aware of the results, and please inform all sexual partners if you test positive for any of these diseases.  You were given a prescription for antibiotics. Please take the antibiotic prescription fully.   Please follow-up with a dentist in the next 5 to 7 days for reevaluation.  If you do not have a dentist, resources were provided for dentist in the area in your discharge summary.  Please contact one of the offices that are listed and make an appointment for follow-up.  Please return to the emergency department for any new or worsening symptoms.

## 2019-04-20 LAB — GC/CHLAMYDIA PROBE AMP (~~LOC~~) NOT AT ARMC
Chlamydia: NEGATIVE
Neisseria Gonorrhea: NEGATIVE

## 2019-04-20 LAB — RPR: RPR Ser Ql: NONREACTIVE

## 2019-10-23 ENCOUNTER — Other Ambulatory Visit: Payer: Self-pay

## 2019-10-23 ENCOUNTER — Emergency Department (HOSPITAL_COMMUNITY)
Admission: EM | Admit: 2019-10-23 | Discharge: 2019-10-23 | Disposition: A | Discharge status: Home / Self Care | Payer: Medicaid Other | Attending: Emergency Medicine | Admitting: Emergency Medicine

## 2019-10-23 DIAGNOSIS — Z711 Person with feared health complaint in whom no diagnosis is made: Secondary | ICD-10-CM

## 2019-10-23 DIAGNOSIS — Z8673 Personal history of transient ischemic attack (TIA), and cerebral infarction without residual deficits: Secondary | ICD-10-CM | POA: Insufficient documentation

## 2019-10-23 DIAGNOSIS — Z202 Contact with and (suspected) exposure to infections with a predominantly sexual mode of transmission: Secondary | ICD-10-CM | POA: Insufficient documentation

## 2019-10-23 DIAGNOSIS — F17210 Nicotine dependence, cigarettes, uncomplicated: Secondary | ICD-10-CM | POA: Insufficient documentation

## 2019-10-23 DIAGNOSIS — E119 Type 2 diabetes mellitus without complications: Secondary | ICD-10-CM | POA: Insufficient documentation

## 2019-10-23 DIAGNOSIS — F1210 Cannabis abuse, uncomplicated: Secondary | ICD-10-CM | POA: Insufficient documentation

## 2019-10-23 DIAGNOSIS — K0889 Other specified disorders of teeth and supporting structures: Secondary | ICD-10-CM | POA: Insufficient documentation

## 2019-10-23 MED ORDER — AZITHROMYCIN 250 MG PO TABS
1000.0000 mg | ORAL_TABLET | Freq: Once | ORAL | Status: AC
  Administered 2019-10-23: 13:00:00 1000 mg via ORAL
  Filled 2019-10-23: qty 4

## 2019-10-23 MED ORDER — CEFTRIAXONE SODIUM 250 MG IJ SOLR
250.0000 mg | Freq: Once | INTRAMUSCULAR | Status: AC
  Administered 2019-10-23: 13:00:00 250 mg via INTRAMUSCULAR
  Filled 2019-10-23: qty 250

## 2019-10-23 MED ORDER — AMOXICILLIN-POT CLAVULANATE 875-125 MG PO TABS: 1.0000 | ORAL_TABLET | Freq: Two times a day (BID) | ORAL | 0 refills | Status: DC

## 2019-10-23 NOTE — ED Triage Notes (Signed)
Pt here for evaluation of L sided dental pain and swelling x a few months. Pt also endorses a "lump" in his genital area so he wants to get checked for STDs.

## 2019-10-23 NOTE — ED Provider Notes (Signed)
[place] EMERGENCY DEPARTMENT Provider Note   CSN: [id number] Arrival date & time: 10/23/19  0815     History   Chief Complaint Chief Complaint  Patient presents with  . Dental Pain  . Exposure to STD    HPI [name] [name] is a 28 y.o. male with a past medical history of prior stroke presenting to the ED with multiple complaints. 1.  Left-sided dental pain intermittent for the past few months.  He was told in the past that he needed a replacement tooth in this area but has not been able to follow-up with his dentist due to insurance issues.  States that the area chipped "like 120 days ago or something" while he was eating.  States that the pain flares up from time to time.  He has not tried any medications to help with his symptoms.  Denies any trauma to the area, trouble breathing, neck pain, fever or drainage from the area. 2.  Also complains of dysuria and concern for STDs.  Reports "lumps" bilaterally in his groin area which he states has happened to him in the past when he was diagnosed with an STD.  He did have unprotected sexual intercourse with a male partner last week but has no known exposures for any specific STDs.  States that he would like to be tested and treated.  Denies any testicular pain or swelling, penile discharge, fever.     HPI  Past Medical History:  Diagnosis Date  . Diabetes ([place])   . Heart defect   . Stroke [facility])     Patient Active Problem List   Diagnosis Date Noted  . Stroke ([place])   . Heart defect   . Diabetes ([place])   . TRANSIENT ISCHEMIC ATTACK 04/15/2008  . 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 INSOMNIA 03/31/2008  . ACNE 10/19/2007  . ATTENTION DEFICIT, W/HYPERACTIVITY 02/02/2007    Past Surgical History:  Procedure Laterality Date  . CARDIAC SURGERY          Home Medications    Prior to Admission medications   Medication Sig Start Date End Date Taking? Authorizing Provider   amoxicillin-clavulanate (AUGMENTIN) 875-125 MG tablet Take 1 tablet by mouth every 12 (twelve) hours. 10/23/19   Delia Heady, PA-C    Family History No family history on file.  Social History Social History   Tobacco Use  . Smoking status: Current Every Day Smoker    Packs/day: 0.50    Types: Cigarettes  . Smokeless tobacco: Never Used  Substance Use Topics  . Alcohol use: No  . Drug use: Yes    Types: Marijuana     Allergies   Patient has no known allergies.   Review of Systems Review of Systems  Constitutional: Negative for chills and fever.  HENT: Positive for dental problem. Negative for facial swelling.   Genitourinary: Positive for dysuria. Negative for discharge, penile pain and penile swelling.  Musculoskeletal: Negative for neck pain.     Physical Exam Updated Vital Signs BP (!) 166/76 (BP Location: Right Arm)   Pulse 60   Temp 98.1 F (36.7 C) (Oral)   Resp 16   SpO2 97%   Physical Exam Vitals signs and nursing note reviewed. Exam conducted with a chaperone present.  Constitutional:      General: He is not in acute distress.    Appearance: He is well-developed. He is not diaphoretic.  HENT:     Head: Normocephalic and atraumatic.  Mouth/Throat:     Dentition: Abnormal dentition. Dental caries present. No dental tenderness, gingival swelling or dental abscesses.      Comments: Tenderness to palpation over the chipped tooth in the indicated area.  No gross dental abscess or site of drainage.  Overall poor dentition. No facial, neck or cheek swelling noted. No pooling of secretions or trismus.  Normal voice noted with no difficulty swallowing or breathing.  No submandibular erythema, edema or crepitus noted. Eyes:     General: No scleral icterus.    Conjunctiva/sclera: Conjunctivae normal.  Neck:     Musculoskeletal: Normal range of motion.  Pulmonary:     Effort: Pulmonary effort is normal. No respiratory distress.  Genitourinary:    Penis:  No discharge.      Scrotum/Testes:        Right: Tenderness not present.        Left: Tenderness not present.     Comments: Bilateral inguinal lymph nodes without tenderness. Normal male genitalia noted. Penis, scrotum, and testicles without swelling, lesions, rashes, or tenderness present. No penile discharge noted. Cremasteric reflex intact. Tech served as Biomedical engineerchaperone during the exam.  Skin:    Findings: No rash.  Neurological:     Mental Status: He is alert.      ED Treatments / Results  Labs (all labs ordered are listed, but only abnormal results are displayed) Labs Reviewed  RPR  HIV ANTIBODY (ROUTINE TESTING W REFLEX)  GC/CHLAMYDIA PROBE AMP ([place]) NOT AT [facility]    EKG None  Radiology No results found.  Procedures Procedures (including critical care time)  Medications Ordered in ED Medications  cefTRIAXone (ROCEPHIN) injection 250 mg (has no administration in time range)  azithromycin (ZITHROMAX) tablet 1,000 mg (has no administration in time range)     Initial Impression / Assessment and Plan / ED Course  I have reviewed the triage vital signs and the nursing notes.  Pertinent labs & imaging results that were available during my care of the patient were reviewed by me and considered in my medical decision making (see chart for details).        Patient with dentalgia. On exam, there is no evidence of a drainable abscess. No trismus, glossal elevation, unilateral tonsillar swelling. No evidence of retropharyngeal or peritonsillar abscess or Ludwig angina. Will treat with  NSAIDs and Augmentin. Pt instructed to follow-up with dentist as soon as possible.   Patient given antibiotic treatment for gonorrhea and chlamydia and awaiting remainder of STD panel.  Resource guide provided with AVS.  Patient is hemodynamically stable, in NAD, and able to ambulate in the ED. Evaluation does not show pathology that would require ongoing emergent intervention or inpatient  treatment. I explained the diagnosis to the patient. Pain has been managed and has no complaints prior to discharge. Patient is comfortable with above plan and is stable for discharge at this time. All questions were answered prior to disposition. Strict return precautions for returning to the ED were discussed. Encouraged follow up with PCP.   An After Visit Summary was printed and given to the patient.   Portions of this note were generated with Scientist, clinical (histocompatibility and immunogenetics)Dragon dictation software. Dictation errors may occur despite best attempts at proofreading.  Final Clinical Impressions(s) / ED Diagnoses   Final diagnoses:  Pain, dental  Concern about STD in male without diagnosis    ED Discharge Orders         Ordered    amoxicillin-clavulanate (AUGMENTIN) 875-125 MG tablet  Every  12 hours     10/23/19 1309           Dietrich Pates, PA-C 10/23/19 1309    Terald Sleeper, MD 10/24/19 [phone]

## 2019-10-23 NOTE — Discharge Instructions (Signed)
Take antibiotics as directed. Return to the ED if you start to have worsening symptoms, increased swelling, redness, drainage or trouble breathing.

## 2019-10-24 LAB — RPR: RPR Ser Ql: NONREACTIVE

## 2019-10-24 LAB — GC/CHLAMYDIA PROBE AMP (~~LOC~~) NOT AT ARMC
Chlamydia: NEGATIVE
Neisseria Gonorrhea: NEGATIVE

## 2019-10-24 LAB — HIV ANTIBODY (ROUTINE TESTING W REFLEX): HIV Screen 4th Generation wRfx: NONREACTIVE — AB

## 2019-12-04 ENCOUNTER — Encounter (HOSPITAL_COMMUNITY): Payer: Self-pay | Admitting: Emergency Medicine

## 2019-12-04 ENCOUNTER — Emergency Department (HOSPITAL_COMMUNITY)
Admission: EM | Admit: 2019-12-04 | Discharge: 2019-12-04 | Disposition: A | Discharge status: Home / Self Care | Payer: Medicaid Other | Attending: Emergency Medicine | Admitting: Emergency Medicine

## 2019-12-04 ENCOUNTER — Other Ambulatory Visit: Payer: Self-pay

## 2019-12-04 DIAGNOSIS — K0889 Other specified disorders of teeth and supporting structures: Secondary | ICD-10-CM | POA: Insufficient documentation

## 2019-12-04 DIAGNOSIS — K047 Periapical abscess without sinus: Secondary | ICD-10-CM

## 2019-12-04 DIAGNOSIS — F17210 Nicotine dependence, cigarettes, uncomplicated: Secondary | ICD-10-CM | POA: Insufficient documentation

## 2019-12-04 DIAGNOSIS — Z202 Contact with and (suspected) exposure to infections with a predominantly sexual mode of transmission: Secondary | ICD-10-CM | POA: Insufficient documentation

## 2019-12-04 DIAGNOSIS — E119 Type 2 diabetes mellitus without complications: Secondary | ICD-10-CM | POA: Insufficient documentation

## 2019-12-04 MED ORDER — TRAMADOL HCL 50 MG PO TABS: 50.0000 mg | ORAL_TABLET | Freq: Four times a day (QID) | ORAL | 0 refills | Status: DC | PRN

## 2019-12-04 MED ORDER — CEFTRIAXONE SODIUM 250 MG IJ SOLR
250.0000 mg | Freq: Once | INTRAMUSCULAR | Status: AC
  Administered 2019-12-04: 11:00:00 250 mg via INTRAMUSCULAR
  Filled 2019-12-04: qty 250

## 2019-12-04 MED ORDER — IBUPROFEN 800 MG PO TABS: 800.0000 mg | ORAL_TABLET | Freq: Three times a day (TID) | ORAL | 0 refills | Status: DC | PRN

## 2019-12-04 MED ORDER — PENICILLIN V POTASSIUM 500 MG PO TABS: 500.0000 mg | ORAL_TABLET | Freq: Four times a day (QID) | ORAL | 0 refills | Status: DC

## 2019-12-04 MED ORDER — AZITHROMYCIN 250 MG PO TABS
1000.0000 mg | ORAL_TABLET | Freq: Once | ORAL | Status: AC
  Administered 2019-12-04: 1000 mg via ORAL
  Filled 2019-12-04: qty 4

## 2019-12-04 MED ORDER — STERILE WATER FOR INJECTION IJ SOLN
INTRAMUSCULAR | Status: AC
  Filled 2019-12-04: qty 10

## 2019-12-04 NOTE — Discharge Instructions (Signed)
Return here as needed. Follow up with the dentist provided. °

## 2019-12-04 NOTE — ED Triage Notes (Signed)
C/o R upper dental pain x 2-3 months.  Recently started having R sided facial swelling.  Also reports "lump" to R groin x 4 days with "stinging" with urination.  Requesting STD check.  Pt states he went this morning and picked up Rx at pharmacy for Amoxicillin that he never got filled before because he figured that was what he would get a Rx for at hospital.

## 2019-12-05 LAB — GC/CHLAMYDIA PROBE AMP (~~LOC~~) NOT AT ARMC
Chlamydia: NEGATIVE
Neisseria Gonorrhea: NEGATIVE

## 2019-12-08 NOTE — ED Provider Notes (Signed)
[facility] EMERGENCY DEPARTMENT Provider Note   CSN: [id number] Arrival date & time: 12/04/19  8676     History Chief Complaint  Patient presents with  . Dental Pain  . Groin Pain    [name] [name] is a 29 y.o. male.  HPI Patient presents to the emergency department with inguinal pain along with pain with urination.  The patient states he is also get some dental pain has been ongoing over the last 2 to 3 months.  The patient states the genital issue started 2 to 3 days ago has had recent unprotected sex.  The patient denies chest pain, shortness of breath, headache,blurred vision, neck pain, fever, cough, weakness, numbness, dizziness, anorexia, edema, abdominal pain, nausea, vomiting, diarrhea, rash, back pain, dysuria, hematemesis, bloody stool, near syncope, or syncope.    Past Medical History:  Diagnosis Date  . Diabetes ([place])   . Heart defect   . Stroke [facility])     Patient Active Problem List   Diagnosis Date Noted  . Stroke ([place])   . Heart defect   . Diabetes ([place])   . TRANSIENT ISCHEMIC ATTACK 04/15/2008  . 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 INSOMNIA 03/31/2008  . ACNE 10/19/2007  . ATTENTION DEFICIT, W/HYPERACTIVITY 02/02/2007    Past Surgical History:  Procedure Laterality Date  . CARDIAC SURGERY         No family history on file.  Social History   Tobacco Use  . Smoking status: Current Every Day Smoker    Packs/day: 0.50    Types: Cigarettes  . Smokeless tobacco: Never Used  Substance Use Topics  . Alcohol use: No  . Drug use: Yes    Types: Marijuana    Home Medications Prior to Admission medications   Medication Sig Start Date End Date Taking? Authorizing Provider  ibuprofen (ADVIL) 800 MG tablet Take 1 tablet (800 mg total) by mouth every 8 (eight) hours as needed. 12/04/19   Secilia Apps, Harrell Gave, PA-C  penicillin v potassium (VEETID) 500 MG tablet Take 1 tablet (500 mg total) by mouth 4 (four) times  daily. 12/04/19   Cosimo Schertzer, Harrell Gave, PA-C  traMADol (ULTRAM) 50 MG tablet Take 1 tablet (50 mg total) by mouth every 6 (six) hours as needed for severe pain. 12/04/19   Dalia Heading, PA-C    Allergies    Patient has no known allergies.  Review of Systems   Review of Systems All other systems negative except as documented in the HPI. All pertinent positives and negatives as reviewed in the HPI. Physical Exam Updated Vital Signs BP (!) 129/93 (BP Location: Right Arm)   Pulse (!) 59   Temp 99.3 F (37.4 C) (Oral)   Resp 16   SpO2 100%   Physical Exam Vitals and nursing note reviewed.  Constitutional:      General: He is not in acute distress.    Appearance: He is well-developed.  HENT:     Head: Normocephalic and atraumatic.  Eyes:     Pupils: Pupils are equal, round, and reactive to light.  Pulmonary:     Effort: Pulmonary effort is normal.  Genitourinary:    Testes:        Right: Mass not present.        Left: Mass not present.     Epididymis:     Right: Normal.     Left: Normal.    Skin:    General: Skin is warm and dry.  Neurological:  Mental Status: He is alert and oriented to person, place, and time.     ED Results / Procedures / Treatments   Labs (all labs ordered are listed, but only abnormal results are displayed) Labs Reviewed  GC/CHLAMYDIA PROBE AMP ([place]) NOT AT [facility]    EKG None  Radiology No results found.  Procedures Procedures (including critical care time)  Medications Ordered in ED Medications  cefTRIAXone (ROCEPHIN) injection 250 mg (250 mg Intramuscular Given 12/04/19 1050)  azithromycin (ZITHROMAX) tablet 1,000 mg (1,000 mg Oral Given 12/04/19 1050)    ED Course  I have reviewed the triage vital signs and the nursing notes.  Pertinent labs & imaging results that were available during my care of the patient were reviewed by me and considered in my medical decision making (see chart for details).    MDM  Rules/Calculators/A&P                      Patient be treated for STD and has dental issues.  Have referred him to dentistry as well.  Patient is advised the plan and all questions were answered. Final Clinical Impression(s) / ED Diagnoses Final diagnoses:  Possible exposure to STD  Dental infection    Rx / DC Orders ED Discharge Orders         Ordered    ibuprofen (ADVIL) 800 MG tablet  Every 8 hours PRN     12/04/19 1150    traMADol (ULTRAM) 50 MG tablet  Every 6 hours PRN     12/04/19 1150    penicillin v potassium (VEETID) 500 MG tablet  4 times daily     12/04/19 [address] PA-C 12/08/19 1610    Arby Barrette, MD 12/09/19 [phone]

## 2020-01-26 ENCOUNTER — Other Ambulatory Visit: Payer: Self-pay

## 2020-01-26 DIAGNOSIS — Z20822 Contact with and (suspected) exposure to covid-19: Secondary | ICD-10-CM

## 2020-01-27 LAB — NOVEL CORONAVIRUS, NAA: SARS-CoV-2, NAA: NOT DETECTED

## 2020-02-15 ENCOUNTER — Other Ambulatory Visit: Payer: Self-pay

## 2020-02-15 ENCOUNTER — Emergency Department (HOSPITAL_COMMUNITY)
Admission: EM | Admit: 2020-02-15 | Discharge: 2020-02-15 | Disposition: A | Discharge status: Home / Self Care | Payer: No Typology Code available for payment source | Attending: Emergency Medicine | Admitting: Emergency Medicine

## 2020-02-15 ENCOUNTER — Encounter (HOSPITAL_COMMUNITY): Payer: Self-pay | Admitting: Emergency Medicine

## 2020-02-15 ENCOUNTER — Emergency Department (HOSPITAL_COMMUNITY): Payer: No Typology Code available for payment source

## 2020-02-15 DIAGNOSIS — F17210 Nicotine dependence, cigarettes, uncomplicated: Secondary | ICD-10-CM | POA: Insufficient documentation

## 2020-02-15 DIAGNOSIS — Z79899 Other long term (current) drug therapy: Secondary | ICD-10-CM | POA: Insufficient documentation

## 2020-02-15 DIAGNOSIS — Y93I9 Activity, other involving external motion: Secondary | ICD-10-CM | POA: Insufficient documentation

## 2020-02-15 DIAGNOSIS — M25512 Pain in left shoulder: Secondary | ICD-10-CM | POA: Diagnosis not present

## 2020-02-15 DIAGNOSIS — Y92410 Unspecified street and highway as the place of occurrence of the external cause: Secondary | ICD-10-CM | POA: Insufficient documentation

## 2020-02-15 DIAGNOSIS — M545 Low back pain, unspecified: Secondary | ICD-10-CM

## 2020-02-15 DIAGNOSIS — Y999 Unspecified external cause status: Secondary | ICD-10-CM | POA: Diagnosis not present

## 2020-02-15 DIAGNOSIS — M542 Cervicalgia: Secondary | ICD-10-CM | POA: Insufficient documentation

## 2020-02-15 DIAGNOSIS — E119 Type 2 diabetes mellitus without complications: Secondary | ICD-10-CM | POA: Diagnosis not present

## 2020-02-15 MED ORDER — IBUPROFEN 600 MG PO TABS: 600.0000 mg | ORAL_TABLET | Freq: Four times a day (QID) | ORAL | 0 refills | Status: DC | PRN

## 2020-02-15 MED ORDER — CYCLOBENZAPRINE HCL 10 MG PO TABS: 10.0000 mg | ORAL_TABLET | Freq: Two times a day (BID) | ORAL | 0 refills | Status: DC | PRN

## 2020-02-15 MED ORDER — ACETAMINOPHEN 500 MG PO TABS: 500.0000 mg | ORAL_TABLET | Freq: Four times a day (QID) | ORAL | 0 refills | Status: DC | PRN
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
## 2020-02-15 NOTE — ED Notes (Signed)
Patient verbalizes understanding of discharge instructions. Opportunity for questioning and answers were provided. Pt discharged from ED. 

## 2020-02-15 NOTE — Discharge Instructions (Signed)
Your x-rays did not show any evidence of fracture or other concerning abnormalities.   Alternate 600 mg of ibuprofen and [phone] mg of Tylenol every 3-4 hours as needed for pain.  Take ibuprofen with food to avoid upset stomach issues.  Do not exceed 4000 mg of Tylenol daily. You may take Flexeril up to twice daily as needed for muscle spasms. This medication may make you drowsy, so I typically only recommended only taking at night. If this medication makes you drowsy throughout the day, no driving, drinking alcohol, or operating heavy machinery. You may also cut these tablets in half if they are too strong.   Apply ice or heat, whichever feels best, to areas of soreness 20 minutes at a time 3-4 times daily.  Do some gentle stretching throughout the day, especially during or after hot showers or baths. Take short frequent walks and avoid prolonged periods of sitting or laying.   Expect to be sore for the next few day and follow up with primary care physician for recheck of ongoing symptoms (especially if symptoms persist longer than 1 week) but return to ER for emergent changing or worsening of symptoms such as severe headache that gets worse, altered mental status/behaving unusually, persistent vomiting, excessive drowsiness, numbness to the arms or legs, unsteady gait, or slurred speech.
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
## 2020-10-09 ENCOUNTER — Other Ambulatory Visit (HOSPITAL_COMMUNITY): Payer: Self-pay | Admitting: Emergency Medicine

## 2020-10-09 ENCOUNTER — Emergency Department (HOSPITAL_COMMUNITY)
Admission: EM | Admit: 2020-10-09 | Discharge: 2020-10-09 | Disposition: A | Discharge status: Home / Self Care | Payer: Medicaid Other | Attending: Emergency Medicine | Admitting: Emergency Medicine

## 2020-10-09 ENCOUNTER — Other Ambulatory Visit: Payer: Self-pay

## 2020-10-09 DIAGNOSIS — E119 Type 2 diabetes mellitus without complications: Secondary | ICD-10-CM | POA: Diagnosis not present

## 2020-10-09 DIAGNOSIS — F17210 Nicotine dependence, cigarettes, uncomplicated: Secondary | ICD-10-CM | POA: Insufficient documentation

## 2020-10-09 DIAGNOSIS — K0889 Other specified disorders of teeth and supporting structures: Secondary | ICD-10-CM | POA: Insufficient documentation

## 2020-10-09 DIAGNOSIS — Z202 Contact with and (suspected) exposure to infections with a predominantly sexual mode of transmission: Secondary | ICD-10-CM | POA: Diagnosis not present

## 2020-10-09 DIAGNOSIS — Z8673 Personal history of transient ischemic attack (TIA), and cerebral infarction without residual deficits: Secondary | ICD-10-CM | POA: Insufficient documentation

## 2020-10-09 LAB — URINALYSIS, ROUTINE W REFLEX MICROSCOPIC
Bilirubin Urine: NEGATIVE
Glucose, UA: NEGATIVE mg/dL
Hgb urine dipstick: NEGATIVE
Ketones, ur: 5 mg/dL — AB
Leukocytes,Ua: NEGATIVE
Nitrite: NEGATIVE
Protein, ur: NEGATIVE mg/dL
Specific Gravity, Urine: 1.015 (ref 1.005–1.030)
pH: 6 (ref 5.0–8.0)

## 2020-10-09 MED ORDER — DOXYCYCLINE HYCLATE 100 MG PO CAPS: 100.0000 mg | ORAL_CAPSULE | Freq: Two times a day (BID) | ORAL | 0 refills | Status: AC

## 2020-10-09 MED ORDER — DOXYCYCLINE HYCLATE 100 MG PO TABS
100.0000 mg | ORAL_TABLET | Freq: Once | ORAL | Status: AC
  Administered 2020-10-09: 100 mg via ORAL
  Filled 2020-10-09: qty 1

## 2020-10-09 MED ORDER — METRONIDAZOLE 500 MG PO TABS
2000.0000 mg | ORAL_TABLET | Freq: Once | ORAL | Status: AC
  Administered 2020-10-09: 2000 mg via ORAL
  Filled 2020-10-09: qty 4

## 2020-10-09 MED ORDER — CEFTRIAXONE SODIUM 500 MG IJ SOLR
500.0000 mg | Freq: Once | INTRAMUSCULAR | Status: AC
  Administered 2020-10-09: 500 mg via INTRAMUSCULAR
  Filled 2020-10-09: qty 500

## 2020-10-09 NOTE — ED Triage Notes (Signed)
Pt reports dental pain from tooth that he knows he needs to have removed. Also abdominal pain and penile discharge and pain with urination.

## 2020-10-09 NOTE — ED Provider Notes (Signed)
[name] EMERGENCY DEPARTMENT Provider Note   CSN: [id number] Arrival date & time: 10/09/20  0932     History Chief Complaint  Patient presents with  . Dental Pain  . SEXUALLY TRANSMITTED DISEASE    [name] [name] is a 29 y.o. male.  Patient also here as sexual partner tested positive for trichomonas and chlamydia.  Not having symptoms except for some pain with urination.   Dental Pain Location:  Upper Quality:  Aching Severity:  Mild Onset quality:  Gradual Timing:  Constant Progression:  Unchanged Context: dental caries   Relieved by:  Nothing Worsened by:  Nothing Associated symptoms: no congestion, no difficulty swallowing, no drooling, no facial pain, no facial swelling, no fever, no gum swelling, no headaches, no neck pain, no neck swelling, no oral bleeding, no oral lesions and no trismus        Past Medical History:  Diagnosis Date  . Diabetes (HCC)   . Heart defect   . Stroke [facility])     Patient Active Problem List   Diagnosis Date Noted  . Stroke (HCC)   . Heart defect   . Diabetes (HCC)   . TRANSIENT ISCHEMIC ATTACK 04/15/2008  . 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 INSOMNIA 03/31/2008  . ACNE 10/19/2007  . ATTENTION DEFICIT, W/HYPERACTIVITY 02/02/2007    Past Surgical History:  Procedure Laterality Date  . CARDIAC SURGERY         No family history on file.  Social History   Tobacco Use  . Smoking status: Current Every Day Smoker    Packs/day: 0.50    Types: Cigarettes  . Smokeless tobacco: Never Used  Vaping Use  . Vaping Use: Never used  Substance Use Topics  . Alcohol use: No  . Drug use: Yes    Types: Marijuana    Home Medications Prior to Admission medications   Medication Sig Start Date End Date Taking? Authorizing Provider  acetaminophen (TYLENOL) 500 MG tablet Take 1 tablet (500 mg total) by mouth every 6 (six) hours as needed. Patient taking differently: Take 500 mg by mouth every  6 (six) hours as needed for mild pain, fever or headache.  02/15/20  Yes Fawze, Mina A, PA-C  doxycycline (VIBRAMYCIN) 100 MG capsule Take 1 capsule (100 mg total) by mouth 2 (two) times daily for 7 days. 10/09/20 10/16/20  Virgina Norfolk, DO    Allergies    Patient has no known allergies.  Review of Systems   Review of Systems  Constitutional: Negative for chills and fever.  HENT: Positive for dental problem. Negative for congestion, drooling, ear pain, facial swelling, mouth sores, sore throat and trouble swallowing.   Eyes: Negative for pain and visual disturbance.  Respiratory: Negative for cough and shortness of breath.   Cardiovascular: Negative for chest pain and palpitations.  Gastrointestinal: Negative for abdominal pain and vomiting.  Genitourinary: Positive for discharge and dysuria. Negative for hematuria, penile pain, penile swelling, scrotal swelling and testicular pain.  Musculoskeletal: Negative for arthralgias, back pain and neck pain.  Skin: Negative for color change and rash.  Neurological: Negative for seizures, syncope and headaches.  All other systems reviewed and are negative.   Physical Exam Updated Vital Signs BP 123/78   Pulse 80   Temp 98.6 F (37 C) (Oral)   Resp 16   Ht 6' (1.829 m)   Wt 86.2 kg   SpO2 97%   BMI 25.77 kg/m   Physical Exam Vitals and  nursing note reviewed.  Constitutional:      General: He is not in acute distress.    Appearance: He is well-developed. He is not ill-appearing.  HENT:     Head: Normocephalic and atraumatic.     Comments: Dental caries    Nose: Nose normal.     Mouth/Throat:     Mouth: Mucous membranes are moist.  Eyes:     Extraocular Movements: Extraocular movements intact.     Conjunctiva/sclera: Conjunctivae normal.     Pupils: Pupils are equal, round, and reactive to light.  Cardiovascular:     Rate and Rhythm: Normal rate and regular rhythm.     Pulses: Normal pulses.     Heart sounds: Normal heart  sounds. No murmur heard.   Pulmonary:     Effort: Pulmonary effort is normal. No respiratory distress.     Breath sounds: Normal breath sounds.  Abdominal:     General: Abdomen is flat.     Palpations: Abdomen is soft.     Tenderness: There is no abdominal tenderness.  Genitourinary:    Comments: Deferred per patient preference Musculoskeletal:        General: Normal range of motion.     Cervical back: Neck supple.  Skin:    General: Skin is warm and dry.     Capillary Refill: Capillary refill takes less than 2 seconds.  Neurological:     General: No focal deficit present.     Mental Status: He is alert.  Psychiatric:        Mood and Affect: Mood normal.     ED Results / Procedures / Treatments   Labs (all labs ordered are listed, but only abnormal results are displayed) Labs Reviewed  URINALYSIS, ROUTINE W REFLEX MICROSCOPIC  GC/CHLAMYDIA PROBE AMP ([place]) NOT AT [facility]    EKG None  Radiology No results found.  Procedures Procedures (including critical care time)  Medications Ordered in ED Medications  cefTRIAXone (ROCEPHIN) injection 500 mg (has no administration in time range)  metroNIDAZOLE (FLAGYL) tablet 2,000 mg (2,000 mg Oral Given 10/09/20 1001)  doxycycline (VIBRA-TABS) tablet 100 mg (100 mg Oral Given 10/09/20 1001)    ED Course  I have reviewed the triage vital signs and the nursing notes.  Pertinent labs & imaging results that were available during my care of the patient were reviewed by me and considered in my medical decision making (see chart for details).    MDM Rules/Calculators/A&P                          [name] [name] is here for dental pain and STD exposure.  States that he was exposed to someone with trichomonas and chlamydia.  Will give empiric treatment for trichomonas, chlamydia and gonorrhea.  Patient having some dysuria and mild discharge but not want GU exam which is reasonable.  Having some dental pain as well we will give him a  list of low-cost dental clinics.  No obvious infection on exam, dental caries throughout.  Educated about sex and other partners need to be treated.  This chart was dictated using voice recognition software.  Despite best efforts to proofread,  errors can occur which can change the documentation meaning.    Final Clinical Impression(s) / ED Diagnoses Final diagnoses:  Pain, dental  STD exposure    Rx / DC Orders ED Discharge Orders         Ordered    doxycycline (  VIBRAMYCIN) 100 MG capsule  2 times daily        10/09/20 1002           [place], Madelaine Bhat, DO 10/09/20 1003

## 2020-10-09 NOTE — ED Notes (Signed)
Pt discharge instructions reviewed with the patient. Patient verbalized understanding of instructions. Pt discharged.

## 2020-10-10 ENCOUNTER — Emergency Department (HOSPITAL_COMMUNITY): Payer: Self-pay

## 2020-10-10 ENCOUNTER — Encounter (HOSPITAL_COMMUNITY): Payer: Self-pay | Admitting: Emergency Medicine

## 2020-10-10 ENCOUNTER — Emergency Department (HOSPITAL_COMMUNITY)
Admission: EM | Admit: 2020-10-10 | Discharge: 2020-10-10 | Disposition: A | Discharge status: Home / Self Care | Payer: Self-pay | Attending: Emergency Medicine | Admitting: Emergency Medicine

## 2020-10-10 DIAGNOSIS — E119 Type 2 diabetes mellitus without complications: Secondary | ICD-10-CM | POA: Insufficient documentation

## 2020-10-10 DIAGNOSIS — L03211 Cellulitis of face: Secondary | ICD-10-CM | POA: Insufficient documentation

## 2020-10-10 DIAGNOSIS — F17210 Nicotine dependence, cigarettes, uncomplicated: Secondary | ICD-10-CM | POA: Insufficient documentation

## 2020-10-10 DIAGNOSIS — K047 Periapical abscess without sinus: Secondary | ICD-10-CM | POA: Insufficient documentation

## 2020-10-10 LAB — GC/CHLAMYDIA PROBE AMP (~~LOC~~) NOT AT ARMC
Chlamydia: NEGATIVE
Comment: NEGATIVE
Comment: NORMAL
Neisseria Gonorrhea: NEGATIVE

## 2020-10-10 LAB — I-STAT CHEM 8, ED
BUN: 11 mg/dL (ref 6–20)
Calcium, Ion: 1.19 mmol/L (ref 1.15–1.40)
Chloride: 104 mmol/L (ref 98–111)
Creatinine, Ser: 0.9 mg/dL (ref 0.61–1.24)
Glucose, Bld: 74 mg/dL (ref 70–99)
HCT: 45 % (ref 39.0–52.0)
Hemoglobin: 15.3 g/dL (ref 13.0–17.0)
Potassium: 3.5 mmol/L (ref 3.5–5.1)
Sodium: 140 mmol/L (ref 135–145)
TCO2: 24 mmol/L (ref 22–32)

## 2020-10-10 MED ORDER — KETOROLAC TROMETHAMINE 15 MG/ML IJ SOLN
15.0000 mg | Freq: Once | INTRAMUSCULAR | Status: AC
  Administered 2020-10-10: 15 mg via INTRAMUSCULAR
  Filled 2020-10-10: qty 1

## 2020-10-10 MED ORDER — CLINDAMYCIN HCL 150 MG PO CAPS
300.0000 mg | ORAL_CAPSULE | Freq: Once | ORAL | Status: AC
  Administered 2020-10-10: 300 mg via ORAL
  Filled 2020-10-10: qty 2

## 2020-10-10 MED ORDER — IOHEXOL 300 MG/ML  SOLN
75.0000 mL | Freq: Once | INTRAMUSCULAR | Status: AC | PRN
  Administered 2020-10-10: 75 mL via INTRAVENOUS

## 2020-10-10 MED ORDER — CLINDAMYCIN HCL 150 MG PO CAPS: 300.0000 mg | ORAL_CAPSULE | Freq: Three times a day (TID) | ORAL | 0 refills | Status: AC

## 2020-10-10 MED ORDER — HYDROCODONE-ACETAMINOPHEN 5-325 MG PO TABS
1.0000 | ORAL_TABLET | Freq: Four times a day (QID) | ORAL | 0 refills | Status: DC | PRN
Start: 2020-10-10 — End: 2022-06-03

## 2020-10-10 NOTE — ED Triage Notes (Signed)
Pt has dental pain on right side for 4 days, was seen here yesterday and started on doxycyline he has only had two doses, he is here today due to increase in right sided facial swelling and pain. He did take tylenol with no pain relief.

## 2020-10-10 NOTE — ED Notes (Signed)
Pt d/c home per MD order. Discharge summary reviewed with pt- pt verbalizes understanding. No s/s of acute distress noted at discharge. Ambulatory off unit. Reports discharge ride home.

## 2020-10-10 NOTE — ED Provider Notes (Signed)
[facility] EMERGENCY DEPARTMENT Provider Note   CSN: [id number] Arrival date & time: 10/10/20  3734     History Chief Complaint  Patient presents with  . Dental Pain    [name] [name] is a 29 y.o. male.  The history is provided by the patient.  Dental Pain Location:  Upper Quality:  Constant Severity:  Moderate Onset quality:  Gradual Duration:  3 days Timing:  Constant Progression:  Worsening Chronicity:  New Context: abscess, dental fracture and poor dentition   Previous work-up:  Dental exam Relieved by:  Nothing Worsened by:  Touching, cold food/drink and hot food/drink Ineffective treatments:  Acetaminophen Associated symptoms: facial pain, facial swelling, headaches and oral lesions   Associated symptoms: no drooling, no fever and no neck pain   Risk factors: smoking        Past Medical History:  Diagnosis Date  . Diabetes (HCC)   . Heart defect   . Stroke [facility])     Patient Active Problem List   Diagnosis Date Noted  . Stroke (HCC)   . Heart defect   . Diabetes (HCC)   . TRANSIENT ISCHEMIC ATTACK 04/15/2008  . 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 INSOMNIA 03/31/2008  . ACNE 10/19/2007  . ATTENTION DEFICIT, W/HYPERACTIVITY 02/02/2007    Past Surgical History:  Procedure Laterality Date  . CARDIAC SURGERY         No family history on file.  Social History   Tobacco Use  . Smoking status: Current Every Day Smoker    Packs/day: 0.50    Types: Cigarettes  . Smokeless tobacco: Never Used  Vaping Use  . Vaping Use: Never used  Substance Use Topics  . Alcohol use: No  . Drug use: Yes    Types: Marijuana    Home Medications Prior to Admission medications   Medication Sig Start Date End Date Taking? Authorizing Provider  acetaminophen (TYLENOL) 500 MG tablet Take 1 tablet (500 mg total) by mouth every 6 (six) hours as needed. Patient taking differently: Take 500 mg by mouth every 6 (six) hours as  needed for mild pain, fever or headache.  02/15/20   Michela Pitcher A, PA-C  clindamycin (CLEOCIN) 150 MG capsule Take 2 capsules (300 mg total) by mouth 3 (three) times daily for 7 days. 10/10/20 10/17/20  Gerhard Munch, MD  doxycycline (VIBRAMYCIN) 100 MG capsule Take 1 capsule (100 mg total) by mouth 2 (two) times daily for 7 days. 10/09/20 10/16/20  Curatolo, Adam, DO  HYDROcodone-acetaminophen (NORCO/VICODIN) 5-325 MG tablet Take 1 tablet by mouth every 6 (six) hours as needed for severe pain. 10/10/20   Gerhard Munch, MD    Allergies    Patient has no known allergies.  Review of Systems   Review of Systems  Constitutional: Positive for chills. Negative for fever.  HENT: Positive for dental problem, facial swelling and mouth sores. Negative for drooling, rhinorrhea, sore throat and trouble swallowing.   Eyes: Negative for visual disturbance.  Respiratory: Negative for cough and shortness of breath.   Cardiovascular: Negative for chest pain and leg swelling.  Gastrointestinal: Negative for abdominal pain, diarrhea, nausea and vomiting.  Genitourinary: Negative for difficulty urinating.  Musculoskeletal: Negative for back pain and neck pain.  Skin: Negative for rash.  Neurological: Positive for headaches. Negative for numbness.  All other systems reviewed and are negative.   Physical Exam Updated Vital Signs BP (!) 131/93   Pulse 85   Temp 99.3 F (37.4  C) (Oral)   Resp 18   Ht 6' (1.829 m)   Wt 86.2 kg   SpO2 97%   BMI 25.77 kg/m   Physical Exam Vitals reviewed.  Constitutional:      Appearance: Normal appearance.  HENT:     Head: Normocephalic and atraumatic.     Jaw: No trismus.     Comments: Facial swelling extending from L lip, cheek to nasal bridge and sub orbitally    Nose: Nose normal.     Mouth/Throat:     Lips: Pink.     Mouth: Mucous membranes are moist.     Dentition: Abnormal dentition. Dental caries present.     Pharynx: Oropharynx is clear. Uvula  midline.      Comments: 3 cm fluid collection in L upper gum, tender, non draining  Eyes:     Conjunctiva/sclera: Conjunctivae normal.  Cardiovascular:     Heart sounds: Normal heart sounds.  Pulmonary:     Effort: Pulmonary effort is normal.     Breath sounds: Normal breath sounds.  Abdominal:     General: Abdomen is flat.     Palpations: Abdomen is soft.  Musculoskeletal:     Cervical back: Normal range of motion and neck supple.     Right lower leg: No edema.     Left lower leg: No edema.  Skin:    General: Skin is warm and dry.  Neurological:     Mental Status: He is alert.  Psychiatric:        Mood and Affect: Mood normal.        Behavior: Behavior normal.     ED Results / Procedures / Treatments   Labs (all labs ordered are listed, but only abnormal results are displayed) Labs Reviewed  I-STAT CHEM 8, ED    EKG None  Radiology CT Maxillofacial W Contrast  Result Date: 10/10/2020 CLINICAL DATA:  29 year old male with left facial swelling for 4 days following a left side upper toothache. Progressive swelling despite starting antibiotics. EXAM: CT MAXILLOFACIAL WITH CONTRAST TECHNIQUE: Multidetector CT imaging of the maxillofacial structures was performed with intravenous contrast. Multiplanar CT image reconstructions were also generated. CONTRAST:  96mL OMNIPAQUE IOHEXOL 300 MG/ML  SOLN COMPARISON:  Brain MRI 04/01/2008. FINDINGS: Osseous: Carious left maxillary posterior bicuspid (series 10, image 49) with a large area of periapical lucency and dehiscence of bone along the lateral left maxillary alveolar process (series 4, image 41 and series 9, image 29). The periapical lucency also involves the roots of the anterior bicuspid. Associated semicircular shaped subperiosteal abscess overlying the area of dehiscence and tracking caudally encompasses 17 x 7 x 12 mm (AP by transverse by CC) on series 3 image 39 and series 7, image 28. Regional soft tissue swelling and  stranding. Mild involvement of the lower left masticator space. Thickening of the platysma. Extension of inflammation cephalad toward the left orbit with preseptal swelling there. No other soft tissue fluid collection. Contralateral right maxillary posterior bicuspid dental caries and smaller periapical lucency (series 4, image 41 and series 10, image 26). Mandible intact and normally located. Possible chronic nasal bone fractures. Other visible facial bones, skull base, cervical vertebrae intact. Orbits: Intact orbital walls. Preseptal soft tissue inflammation on the left. Globes and bilateral postseptal soft tissues remain normal. Sinuses: Well pneumatized. There is minor left maxillary alveolar recess mucosal thickening, without definite dehiscence of the floor of the left maxillary sinus. Small right maxillary mucous retention cyst. Tympanic cavities and mastoids are clear.  Soft tissues: Left dental related cellulitis changes described above. Reactive appearing left level 1 B and level 2A lymph nodes. Other soft tissue spaces of the face remain within normal limits. Visible major vascular structures in the neck and at the skull base are patent, including the cavernous sinus. Limited intracranial: Negative. IMPRESSION: 1. Odontogenic Left Cellulitis, including Preseptal left orbit involvement. Carious left maxillary posterior bicuspid with a large periapical lucency, dehiscence of bone, and a 17 x 7 x 12 mm Subperiosteal Abscess. Reactive left level 1 and level 2 lymph nodes. 2. Carious right maxillary posterior bicuspid with smaller periapical lucency. Electronically Signed   By: Odessa FlemingH  Hall M.D.   On: 10/10/2020 08:39    Procedures Procedures (including critical care time)  Medications Ordered in ED Medications  ketorolac (TORADOL) 15 MG/ML injection 15 mg (15 mg Intramuscular Given 10/10/20 0851)  iohexol (OMNIPAQUE) 300 MG/ML solution 75 mL (75 mLs Intravenous Contrast Given 10/10/20 0825)  clindamycin  (CLEOCIN) capsule 300 mg (300 mg Oral Given 10/10/20 [id number])    ED Course  I have reviewed the triage vital signs and the nursing notes.  Pertinent labs & imaging results that were available during my care of the patient were reviewed by me and considered in my medical decision making (see chart for details).    MDM Rules/Calculators/A&P                          Medical Decision Making: Ileana Roupvery A Hankin is a 29 y.o. male who presented to the ED today with dental pain and worsening facial swelling.  Past medical history significant for DM, Stroke Recently evaluated yesterday for dental pain and GU complaints, given empiric tx for STI (clinical concern for Gc/chalmydia) and plan to fu with dentist for extraction  Reviewed and confirmed nursing documentation for past medical history, family history, social history.  On my initial exam, the pt was in NAD, unilateral L facial swelling, fluid collection on L upper gum with adjacent broken tooth No submandibular swelling, or submental tenderness, do not suspect Ludwig's angina at this time. Uvula midline, normal tonsils, no evidence of pharyngeal or tonsillar abscess. No trismus, no pooling secretions, normal work or breathing, no stridor.   Pt given Toradol for pain control.  CT face obtained Odontogenic Left Cellulitis, including Preseptal left orbit Involvement.  Carious left maxillary posterior bicuspid with a large periapical lucency, dehiscence of bone, and a 17 x 7 x 12 mm Subperiosteal abscess.  CT findings concerning for deep dental infection Consulted Oral surgery - will follow up oupt and see pt in clinic Monday Dc with clindamycin for empiric tx of cellulitis and dental abscess. Given rx for short course pain control.    All radiology and laboratory studies reviewed independently and with my attending physician, agree with reading provided by radiologist unless otherwise noted.   Upon reassessing patient, patient was in NAD,  hemodynamically stable. Non toxic appearing.  Based on the above findings, I believe patient is hemodynamically stable for discharge  Patient/and family educated about specific return precautions for given chief complaint and symptoms.  Patient/and family educated about follow-up with PCP and follow up with Oral surgery- Dr. Chales Salmonwsley on Monday.  Patient/and family expressed understanding of return precautions and need for follow-up.  Patient discharged.  The above care was discussed with and agreed upon by my attending physician. Emergency Department Medication Summary:  Medications  ketorolac (TORADOL) 15 MG/ML injection 15 mg (15 mg Intramuscular Given  10/10/20 0851)  iohexol (OMNIPAQUE) 300 MG/ML solution 75 mL (75 mLs Intravenous Contrast Given 10/10/20 0825)  clindamycin (CLEOCIN) capsule 300 mg (300 mg Oral Given 10/10/20 0938)       Final Clinical Impression(s) / ED Diagnoses Final diagnoses:  Dental abscess  Facial cellulitis    Rx / DC Orders ED Discharge Orders         Ordered    clindamycin (CLEOCIN) 150 MG capsule  3 times daily        10/10/20 1305    HYDROcodone-acetaminophen (NORCO/VICODIN) 5-325 MG tablet  Every 6 hours PRN        10/10/20 1305           Brantley Fling, MD 10/10/20 1310    Gerhard Munch, MD 10/10/20 1323

## 2020-10-10 NOTE — Discharge Instructions (Addendum)
Please be sure to follow-up with our oral surgeon, Dr. Chales Salmon on Monday.  Monitor your condition carefully and do not hesitate to return here for concerning changes.

## 2020-10-10 NOTE — ED Notes (Signed)
Pt transported to CT ?

## 2021-06-16 ENCOUNTER — Other Ambulatory Visit: Payer: Self-pay

## 2021-06-16 ENCOUNTER — Encounter (HOSPITAL_COMMUNITY): Payer: Self-pay

## 2021-06-16 ENCOUNTER — Ambulatory Visit (HOSPITAL_COMMUNITY): Payer: Self-pay

## 2021-06-16 ENCOUNTER — Ambulatory Visit (HOSPITAL_COMMUNITY)
Admission: EM | Admit: 2021-06-16 | Discharge: 2021-06-16 | Disposition: A | Discharge status: Home / Self Care | Payer: Self-pay | Attending: Internal Medicine | Admitting: Internal Medicine

## 2021-06-16 DIAGNOSIS — R197 Diarrhea, unspecified: Secondary | ICD-10-CM

## 2021-06-16 DIAGNOSIS — Z20822 Contact with and (suspected) exposure to covid-19: Secondary | ICD-10-CM

## 2021-06-16 DIAGNOSIS — R110 Nausea: Secondary | ICD-10-CM

## 2021-06-16 DIAGNOSIS — A084 Viral intestinal infection, unspecified: Secondary | ICD-10-CM

## 2021-06-16 LAB — SARS CORONAVIRUS 2 (TAT 6-24 HRS): SARS Coronavirus 2: NEGATIVE

## 2021-06-16 MED ORDER — ONDANSETRON HCL 4 MG PO TABS: 4.0000 mg | ORAL_TABLET | Freq: Four times a day (QID) | ORAL | 0 refills | Status: DC | PRN

## 2021-06-16 NOTE — ED Triage Notes (Signed)
Pt presents with c/o nausea, diarrhea that started yesterday.  States there was a COVID outbreak at his job.

## 2021-06-16 NOTE — ED Provider Notes (Signed)
[facility]    CSN: [id number] Arrival date & time: 06/16/21  1346      History   Chief Complaint No chief complaint on file.   HPI [name] [name] is a 30 y.o. male.   Patient presents with 1 day history of nausea and diarrhea.  Had approximately 4 bowel movements today.  Denies any blood in stool.  Denies any vomiting.  Denies any dizziness, chest pain, shortness of breath.  Denies any upper respiratory symptoms or sore throat.  Denies any abdominal pain.  Denies any known fever.  Patient has been able to drink fluids and eat food.  States that there has been a COVID-19 outbreak recently at job and employer is requiring testing.    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 Authorizing Provider  ondansetron (ZOFRAN) 4 MG tablet Take 1 tablet (4 mg total) by mouth every 6 (six) hours as needed for nausea or vomiting. 06/16/21  Yes Lance Muss, FNP  acetaminophen (TYLENOL) 500 MG tablet Take 1 tablet (500 mg total) by mouth every 6 (six) hours as needed. Patient taking differently: Take 500 mg by mouth every 6 (six) hours as needed for mild pain, fever or headache.  02/15/20   Luevenia Maxin, Mina A, PA-C  doxycycline (VIBRAMYCIN) 100 MG capsule TAKE 1 CAPSULE BY MOUTH TWICE DAILY FOR 7 DAYS 10/09/20 10/09/21  Virgina Norfolk, DO  HYDROcodone-acetaminophen (NORCO/VICODIN) 5-325 MG tablet Take 1 tablet by mouth every 6 (six) hours as needed for severe pain. 10/10/20    Gerhard Munch, MD    Family History History reviewed. No pertinent family history.  Social History Social History   Tobacco Use   Smoking status: Every Day    Packs/day: 0.50    Pack years: 0.00    Types: Cigarettes   Smokeless tobacco: Never  Vaping Use   Vaping Use: Never used  Substance Use Topics   Alcohol use: No   Drug use: Yes    Types: Marijuana     Allergies   Patient has no known allergies.   Review of Systems Review of Systems Per HPI  Physical Exam Triage Vital Signs ED Triage Vitals  Enc Vitals Group     BP 06/16/21 1521 125/76     Pulse Rate 06/16/21 1521 62     Resp 06/16/21 1521 19     Temp 06/16/21 1521 97.9 F (36.6 C)     Temp Source 06/16/21 1521 Oral     SpO2 06/16/21 1521 97 %     Weight --      Height --      Head Circumference --      Peak Flow --      Pain Score 06/16/21 1520 0     Pain Loc --      Pain Edu? --      Excl.  in GC? --    No data found.  Updated Vital Signs BP 125/76 (BP Location: Right Arm)   Pulse 62   Temp 97.9 F (36.6 C) (Oral)   Resp 19   SpO2 97%   Visual Acuity Right Eye Distance:   Left Eye Distance:   Bilateral Distance:    Right Eye Near:   Left Eye Near:    Bilateral Near:     Physical Exam Constitutional:      General: He is not in acute distress.    Appearance: Normal appearance.  HENT:     Head: Normocephalic and atraumatic.     Right Ear: Tympanic membrane and ear canal normal.     Left Ear: Tympanic membrane and ear canal normal.     Nose: Nose normal.     Mouth/Throat:     Pharynx: Oropharynx is clear. No posterior oropharyngeal erythema.  Eyes:     Extraocular Movements: Extraocular movements intact.     Conjunctiva/sclera: Conjunctivae normal.  Cardiovascular:     Rate and Rhythm: Normal rate and regular rhythm.     Pulses: Normal pulses.     Heart sounds: Normal heart sounds.  Pulmonary:     Effort: Pulmonary effort is normal.     Breath sounds: Normal breath  sounds.  Abdominal:     General: Abdomen is flat. Bowel sounds are normal. There is no distension.     Palpations: Abdomen is soft.     Tenderness: There is no abdominal tenderness.  Skin:    General: Skin is warm and dry.  Neurological:     General: No focal deficit present.     Mental Status: He is alert and oriented to person, place, and time. Mental status is at baseline.  Psychiatric:        Mood and Affect: Mood normal.        Behavior: Behavior normal.        Thought Content: Thought content normal.        Judgment: Judgment normal.     UC Treatments / Results  Labs (all labs ordered are listed, but only abnormal results are displayed) Labs Reviewed  SARS CORONAVIRUS 2 (TAT 6-24 HRS)    EKG   Radiology No results found.  Procedures Procedures (including critical care time)  Medications Ordered in UC Medications - No data to display  Initial Impression / Assessment and Plan / UC Course  I have reviewed the triage vital signs and the nursing notes.  Pertinent labs & imaging results that were available during my care of the patient were reviewed by me and considered in my medical decision making (see chart for details).     Suspicious of COVID-19 due to exposure.  COVID-19 viral swab is pending.  Other differential diagnosis could be viral enteritis.  Ondansetron prescribed as needed for nausea.  Patient advised to continue increased clear fluid intake.  Patient advised to go to the hospital if symptoms worsen or if he is unable to keep fluids down. Discussed strict return precautions. Patient verbalized understanding and is agreeable with plan.  Final Clinical Impressions(s) / UC Diagnoses   Final diagnoses:  Exposure to COVID-19 virus  Viral gastroenteritis  Nausea without vomiting  Diarrhea, unspecified type     Discharge Instructions      You are being tested for COVID-19.  We will call if test results are positive.  You have been prescribed nausea  medication to take as needed.  Please continue to drink  plenty of fluids.  Please go to the hospital if symptoms significantly worsen or if you are not able to keep fluids down.     ED Prescriptions     Medication Sig Dispense Auth. Provider   ondansetron (ZOFRAN) 4 MG tablet Take 1 tablet (4 mg total) by mouth every 6 (six) hours as needed for nausea or vomiting. 12 tablet Lance Muss, FNP      PDMP not reviewed this encounter.   Lance Muss, FNP 06/16/21 [phone]

## 2021-06-16 NOTE — Discharge Instructions (Signed)
You are being tested for COVID-19.  We will call if test results are positive.  You have been prescribed nausea medication to take as needed.  Please continue to drink plenty of fluids.  Please go to the hospital if symptoms significantly worsen or if you are not able to keep fluids down.

## 2021-10-14 ENCOUNTER — Encounter (HOSPITAL_COMMUNITY): Payer: Self-pay | Admitting: Emergency Medicine

## 2021-10-14 ENCOUNTER — Other Ambulatory Visit: Payer: Self-pay

## 2021-10-14 ENCOUNTER — Emergency Department (HOSPITAL_COMMUNITY)
Admission: EM | Admit: 2021-10-14 | Discharge: 2021-10-15 | Disposition: A | Discharge status: Home / Self Care | Payer: Medicaid Other | Attending: Emergency Medicine | Admitting: Emergency Medicine

## 2021-10-14 DIAGNOSIS — R102 Pelvic and perineal pain: Secondary | ICD-10-CM | POA: Insufficient documentation

## 2021-10-14 DIAGNOSIS — Z202 Contact with and (suspected) exposure to infections with a predominantly sexual mode of transmission: Secondary | ICD-10-CM | POA: Insufficient documentation

## 2021-10-14 DIAGNOSIS — R300 Dysuria: Secondary | ICD-10-CM | POA: Insufficient documentation

## 2021-10-14 DIAGNOSIS — E119 Type 2 diabetes mellitus without complications: Secondary | ICD-10-CM | POA: Insufficient documentation

## 2021-10-14 DIAGNOSIS — F17210 Nicotine dependence, cigarettes, uncomplicated: Secondary | ICD-10-CM | POA: Insufficient documentation

## 2021-10-14 DIAGNOSIS — D72829 Elevated white blood cell count, unspecified: Secondary | ICD-10-CM | POA: Insufficient documentation

## 2021-10-14 LAB — URINALYSIS, ROUTINE W REFLEX MICROSCOPIC
Bilirubin Urine: NEGATIVE
Glucose, UA: NEGATIVE mg/dL
Hgb urine dipstick: NEGATIVE
Ketones, ur: NEGATIVE mg/dL
Nitrite: NEGATIVE
Protein, ur: NEGATIVE mg/dL
Specific Gravity, Urine: 1.018 (ref 1.005–1.030)
pH: 6 (ref 5.0–8.0)

## 2021-10-14 LAB — HIV ANTIBODY (ROUTINE TESTING W REFLEX): HIV Screen 4th Generation wRfx: NONREACTIVE

## 2021-10-14 NOTE — ED Triage Notes (Addendum)
Pt reports pain w/ urination, increased frequency for a few days w/ pelvic pain. Pt reports having unprotected sexual intercourse, hx of STDs and states this feels similar.Pt also having diarrhea since yesterday.

## 2021-10-14 NOTE — ED Provider Notes (Signed)
Emergency Medicine Provider Triage Evaluation Note  [name] [name] , a 30 y.o. male  was evaluated in triage.  Patient here for STD check.  Reports pain with urination, increased urinary frequency over the past few days with some pelvic pain.  Patient reports having unprotected sexual intercourse.  History of STDs and states this feeling similar.  Denies fevers and chills.  Denies penile discharge.  Review of Systems  Positive: Dysuria, increased urinary frequency Negative: See above  Physical Exam  BP (!) 146/70   Pulse (!) 58   Temp 97.9 F (36.6 C)   Resp 16   SpO2 93%  Gen:   Awake, no distress   Resp:  Normal effort  MSK:   Moves extremities without difficulty  Other:    Medical Decision Making  Medically screening exam initiated at 8:02 PM.  Appropriate orders placed.  [name] Roup Cindric was informed that the remainder of the evaluation will be completed by another provider, this initial triage assessment does not replace that evaluation, and the importance of remaining in the ED until their evaluation is complete.  Request all STDs.  STD labs urinalysis placed.   Therese Sarah 10/14/21 Derald Macleod, MD 10/14/21 [phone]

## 2021-10-15 LAB — GC/CHLAMYDIA PROBE AMP (~~LOC~~) NOT AT ARMC
Chlamydia: NEGATIVE
Comment: NEGATIVE
Comment: NORMAL
Neisseria Gonorrhea: NEGATIVE

## 2021-10-15 LAB — RPR: RPR Ser Ql: NONREACTIVE

## 2021-10-15 MED ORDER — CEFTRIAXONE SODIUM 500 MG IJ SOLR
500.0000 mg | Freq: Once | INTRAMUSCULAR | Status: AC
  Administered 2021-10-15: 500 mg via INTRAMUSCULAR
  Filled 2021-10-15: qty 500

## 2021-10-15 MED ORDER — DOXYCYCLINE HYCLATE 100 MG PO CAPS: 100.0000 mg | ORAL_CAPSULE | Freq: Two times a day (BID) | ORAL | 0 refills | Status: DC

## 2021-10-15 MED ORDER — DOXYCYCLINE HYCLATE 100 MG PO TABS
100.0000 mg | ORAL_TABLET | Freq: Once | ORAL | Status: AC
  Administered 2021-10-15: 100 mg via ORAL
  Filled 2021-10-15: qty 1

## 2021-10-15 MED ORDER — LIDOCAINE HCL (PF) 1 % IJ SOLN
INTRAMUSCULAR | Status: AC
  Filled 2021-10-15: qty 5

## 2021-10-17 NOTE — ED Provider Notes (Signed)
[name] EMERGENCY DEPARTMENT Provider Note   CSN: [id number] Arrival date & time: 10/14/21  1622     History Chief Complaint  Patient presents with   Pelvic Pain   Dysuria   Exposure to STD    Denny Peon A Dade is a 30 y.o. male.  30 year old male who has had unprotected intercourse with a male.  Since that time he has had dysuria but no obvious discharge.  States he has had some painful inguinal lymph nodes as well.  No fevers.  No rashes.   Pelvic Pain This is a recurrent problem. The current episode started yesterday. The problem occurs constantly.  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  Social History   Tobacco Use   Smoking status: Every Day    Packs/day: 0.50    Types: Cigarettes   Smokeless tobacco: Never  Vaping Use   Vaping Use: Never used  Substance Use Topics   Alcohol use: No   Drug use: Yes    Types: Marijuana    Home Medications Prior to Admission medications   Medication Sig Start Date End Date Taking? Authorizing Provider  doxycycline (VIBRAMYCIN) 100 MG capsule Take 1 capsule (100 mg total) by mouth 2 (two) times daily. One po bid x 7 days 10/15/21  Yes Dolton Shaker, Barbara Cower, MD  acetaminophen (TYLENOL) 500 MG tablet Take 1 tablet (500 mg total) by mouth every 6 (six) hours as needed. Patient taking differently: Take 500 mg by mouth every 6 (six) hours as needed for mild pain, fever or headache.  02/15/20   Michela Pitcher  A, PA-C  HYDROcodone-acetaminophen (NORCO/VICODIN) 5-325 MG tablet Take 1 tablet by mouth every 6 (six) hours as needed for severe pain. 10/10/20   Gerhard Munch, MD  ondansetron (ZOFRAN) 4 MG tablet Take 1 tablet (4 mg total) by mouth every 6 (six) hours as needed for nausea or vomiting. 06/16/21   Gustavus Bryant, FNP    Allergies    Patient has no known allergies.  Review of Systems   Review of Systems  Genitourinary:  Positive for dysuria and pelvic pain.  All other systems reviewed and are negative.  Physical Exam Updated Vital Signs BP (!) 149/92 (BP Location: Right Arm)   Pulse (!) 53   Temp 97.7 F (36.5 C) (Oral)   Resp 16   SpO2 99%   Physical Exam Vitals and nursing note reviewed.  Constitutional:      Appearance: He is well-developed.  HENT:     Head: Normocephalic and atraumatic.     Mouth/Throat:     Mouth: Mucous membranes are moist.     Pharynx: Oropharynx is clear.  Eyes:     Pupils: Pupils are equal, round, and reactive to light.  Cardiovascular:     Rate and Rhythm: Normal rate.  Pulmonary:     Effort:  Pulmonary effort is normal. No respiratory distress.  Abdominal:     General: Abdomen is flat. There is no distension.  Genitourinary:    Penis: Normal.      Testes: Normal.  Musculoskeletal:        General: Normal range of motion.     Cervical back: Normal range of motion.  Skin:    General: Skin is warm and dry.  Neurological:     General: No focal deficit present.     Mental Status: He is alert.  Psychiatric:        Mood and Affect: Mood normal.    ED Results / Procedures / Treatments   Labs (all labs ordered are listed, but only abnormal results are displayed) Labs Reviewed  URINALYSIS, ROUTINE W REFLEX MICROSCOPIC - Abnormal; Notable for the following components:      Result Value   Leukocytes,Ua SMALL (*)    Bacteria, UA RARE (*)    All other components within normal limits  RPR  HIV ANTIBODY (ROUTINE TESTING W REFLEX)   GC/CHLAMYDIA PROBE AMP ([place]) NOT AT [facility]    EKG None  Radiology No results found.  Procedures Procedures   Medications Ordered in ED Medications  cefTRIAXone (ROCEPHIN) injection 500 mg (500 mg Intramuscular Given 10/15/21 0539)  doxycycline (VIBRA-TABS) tablet 100 mg (100 mg Oral Given 10/15/21 0539)  lidocaine (PF) (XYLOCAINE) 1 % injection (  Given 10/15/21 0546)    ED Course  I have reviewed the triage vital signs and the nursing notes.  Pertinent labs & imaging results that were available during my care of the patient were reviewed by me and considered in my medical decision making (see chart for details).    MDM Rules/Calculators/A&P                         Patient with leukocytes and bacteria in his urine but he seems to be a little bit too young for urinary tract infection.  We will go and treat as an STD pending cultures.   Final Clinical Impression(s) / ED Diagnoses Final diagnoses:  Dysuria    Rx / DC Orders ED Discharge Orders          Ordered    doxycycline (VIBRAMYCIN) 100 MG capsule  2 times daily        10/15/21 0530             Zonia Caplin, Barbara Cower, MD 10/17/21 2340

## 2022-05-13 ENCOUNTER — Emergency Department (HOSPITAL_COMMUNITY)
Admission: EM | Admit: 2022-05-13 | Discharge: 2022-05-13 | Discharge status: Against Medical Advice | Payer: Medicaid Other

## 2022-05-13 NOTE — ED Notes (Addendum)
Pt called 3x no answer  

## 2022-06-02 IMAGING — CT CT MAXILLOFACIAL W/ CM
3 series · 14 of 47 positions shown, 16 images · IV contrast (APPLIED)
Comparison: Brain MRI 04/01/2008.

CLINICAL DATA: 29-year-old male with left facial swelling for 4
days following a left side upper toothache. Progressive swelling
despite starting antibiotics.

EXAM:
CT MAXILLOFACIAL WITH CONTRAST
TECHNIQUE: Multidetector CT imaging of the maxillofacial structures was
performed with intravenous contrast. Multiplanar CT image
reconstructions were also generated.
CONTRAST:  75mL OMNIPAQUE IOHEXOL 300 MG/ML  SOLN

[Series 3: facial/orbits w 2.0 st · axial · 0.39mm/px · z∈[-128,+8]mm · 8 of 80 slices shown, 10 images]
[im 6/80  brain]
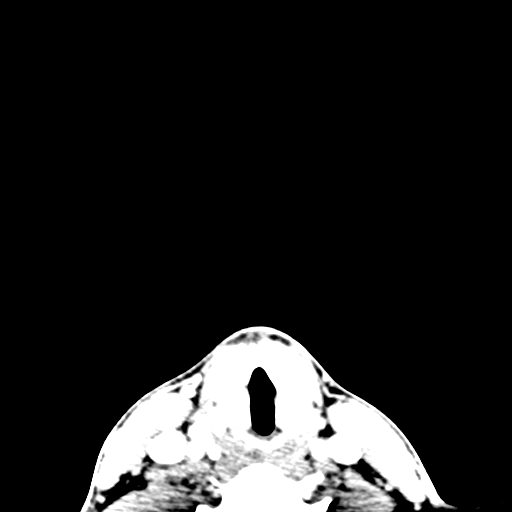
[im 6/80  bone]
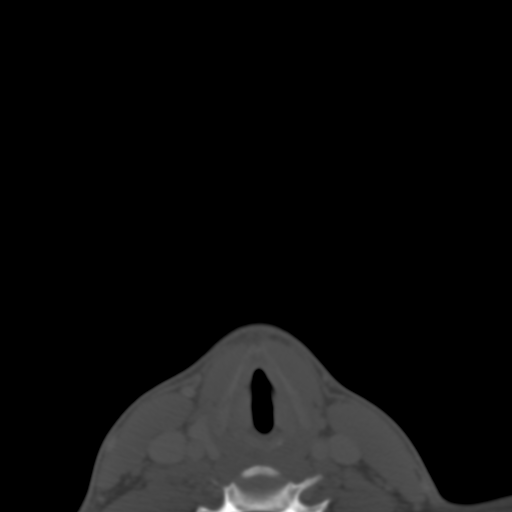
[im 17/80  bone]
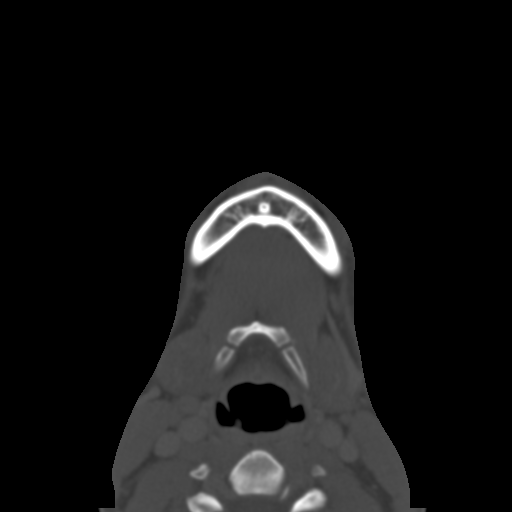
[im 25/80  bone]
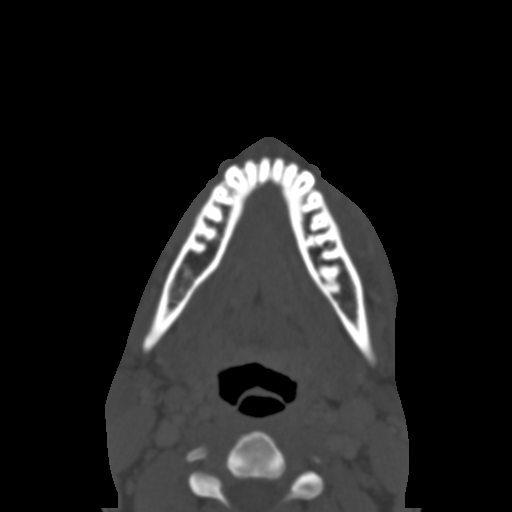
[im 36/80  bone]
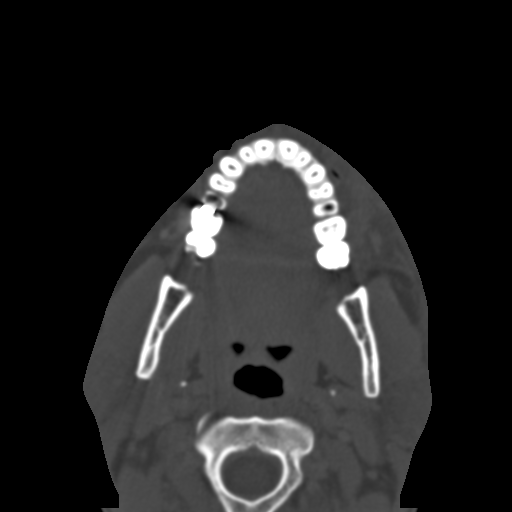
[im 44/80  brain]
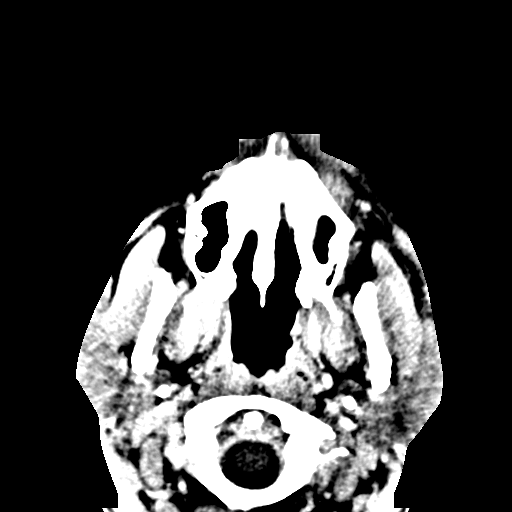
[im 44/80  bone]
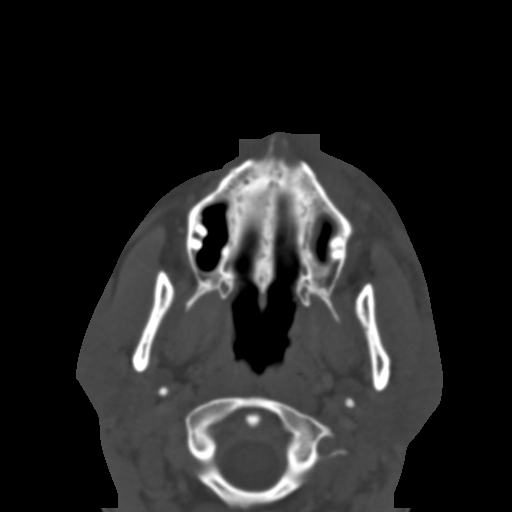
[im 55/80  bone]
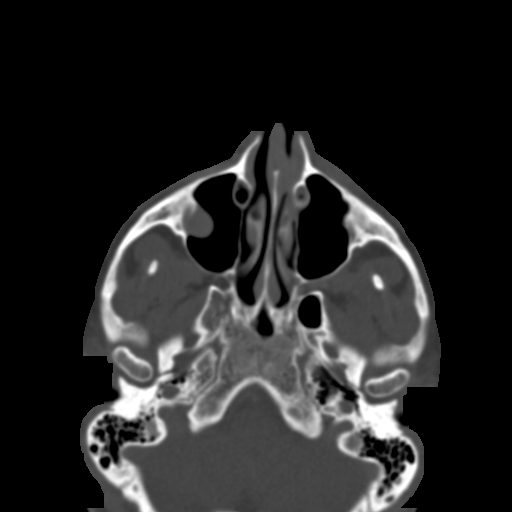
[im 63/80  bone]
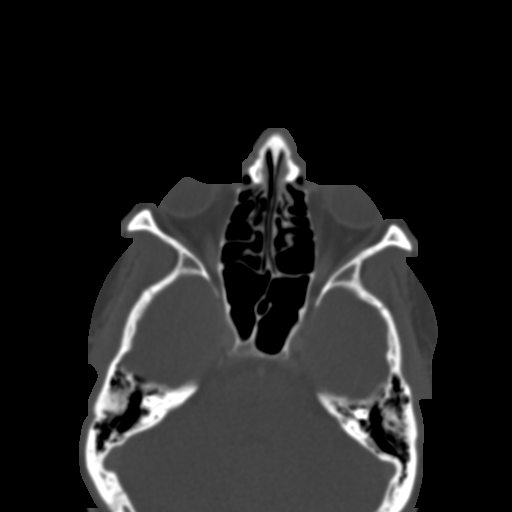
[im 74/80  bone]
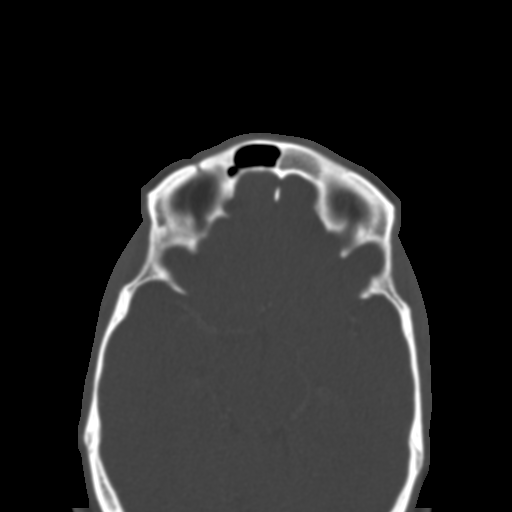

[Series 7: coronal soft tissue · coronal · 0.31mm/px · 3 of 76 slices shown]
[im 26/76  bone]
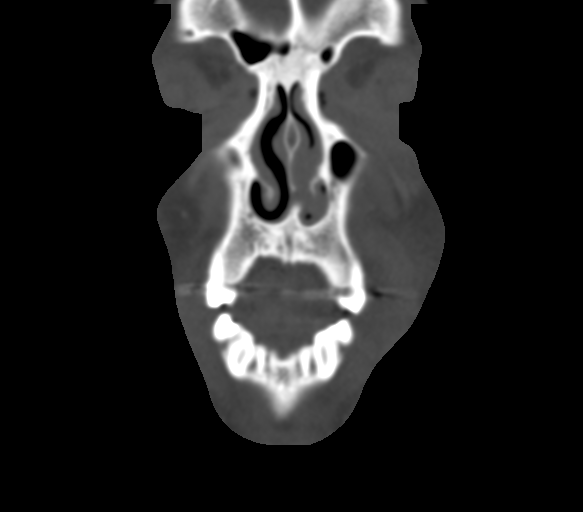
[im 34/76  bone]
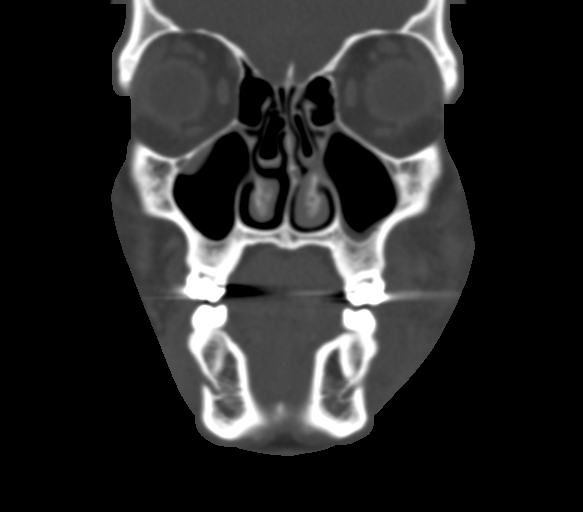
[im 42/76  bone]
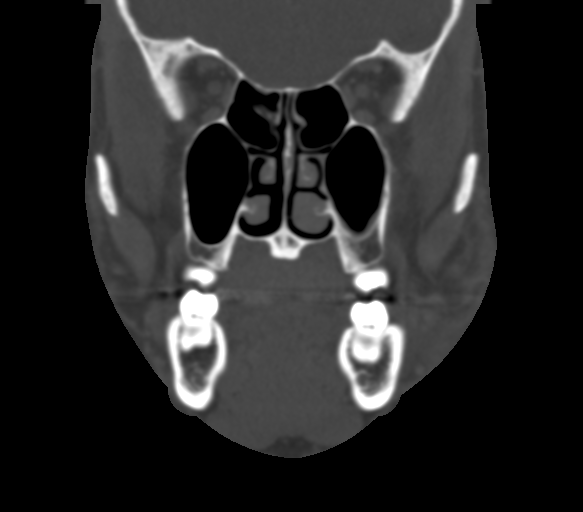

[Series 8: sagittal soft tissue · sagittal · 0.31mm/px · 3 of 76 slices shown]
[im 26/76  bone]
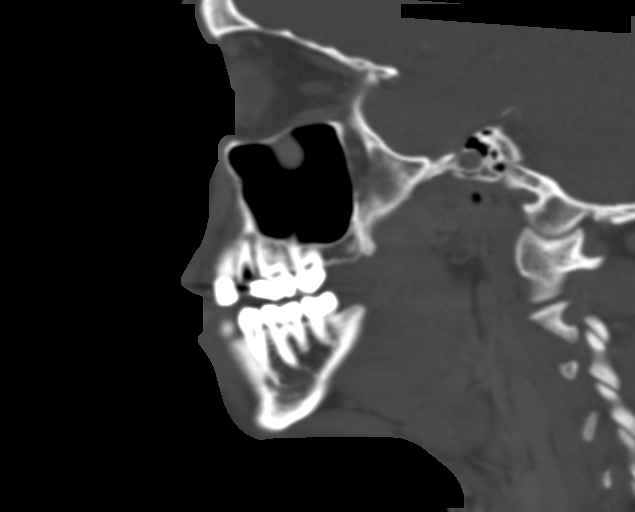
[im 38/76  bone]
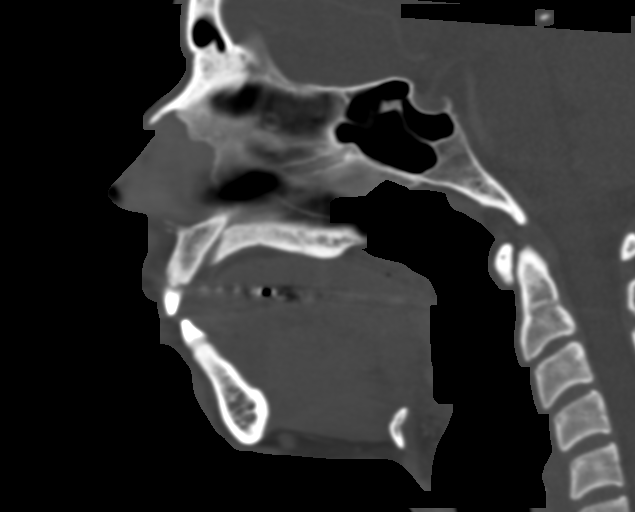
[im 51/76  bone]
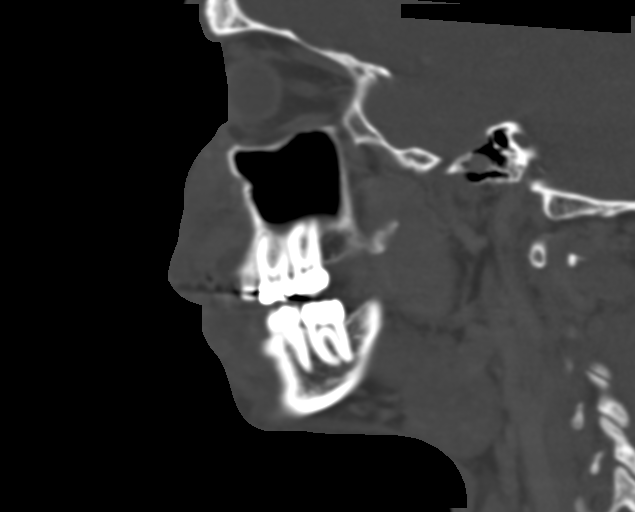

[14 of 47 positions shown; findings below may reference images not displayed]

FINDINGS: Osseous: Carious left maxillary posterior bicuspid (series 10, image
49) with a large area of periapical lucency and dehiscence of bone
along the lateral left maxillary alveolar process (series 4, image
41 and series 9, image 29). The periapical lucency also involves the
roots of the anterior bicuspid.

Associated semicircular shaped subperiosteal abscess overlying the
area of dehiscence and tracking caudally encompasses 17 x 7 x 12 mm
(AP by transverse by CC) on series 3 image 39 and series 7, image
28. Regional soft tissue swelling and stranding. Mild involvement of
the lower left masticator space. Thickening of the platysma.
Extension of inflammation cephalad toward the left orbit with
preseptal swelling there. No other soft tissue fluid collection.

Contralateral right maxillary posterior bicuspid dental caries and
smaller periapical lucency (series 4, image 41 and series 10, image
26).

Mandible intact and normally located. Possible chronic nasal bone
fractures. Other visible facial bones, skull base, cervical
vertebrae intact.

Orbits: Intact orbital walls. Preseptal soft tissue inflammation on
the left. Globes and bilateral postseptal soft tissues remain
normal.

Sinuses: Well pneumatized. There is minor left maxillary alveolar
recess mucosal thickening, without definite dehiscence of the floor
of the left maxillary sinus. Small right maxillary mucous retention
cyst.

Tympanic cavities and mastoids are clear.

Soft tissues: Left dental related cellulitis changes described
above. Reactive appearing left level 1 B and level 2A lymph nodes.

Other soft tissue spaces of the face remain within normal limits.

Visible major vascular structures in the neck and at the skull base
are patent, including the cavernous sinus.

Limited intracranial: Negative.
IMPRESSION: 1. Odontogenic Left Cellulitis, including Preseptal left orbit
involvement.
Carious left maxillary posterior bicuspid with a large periapical
lucency, dehiscence of bone, and a 17 x 7 x 12 mm Subperiosteal
Abscess.
Reactive left level 1 and level 2 lymph nodes.

2. Carious right maxillary posterior bicuspid with smaller
periapical lucency.

## 2022-06-03 ENCOUNTER — Ambulatory Visit (HOSPITAL_COMMUNITY)
Admission: RE | Admit: 2022-06-03 | Discharge: 2022-06-03 | Disposition: A | Discharge status: Home / Self Care | Payer: Medicaid Other | Source: Ambulatory Visit | Attending: Internal Medicine | Admitting: Internal Medicine

## 2022-06-03 ENCOUNTER — Encounter (HOSPITAL_COMMUNITY): Payer: Self-pay

## 2022-06-03 VITALS — BP 124/81 | HR 74 | Temp 98.0°F | Resp 18

## 2022-06-03 DIAGNOSIS — R051 Acute cough: Secondary | ICD-10-CM | POA: Insufficient documentation

## 2022-06-03 DIAGNOSIS — R112 Nausea with vomiting, unspecified: Secondary | ICD-10-CM | POA: Insufficient documentation

## 2022-06-03 DIAGNOSIS — Z20822 Contact with and (suspected) exposure to covid-19: Secondary | ICD-10-CM | POA: Insufficient documentation

## 2022-06-03 DIAGNOSIS — R369 Urethral discharge, unspecified: Secondary | ICD-10-CM | POA: Insufficient documentation

## 2022-06-03 DIAGNOSIS — R197 Diarrhea, unspecified: Secondary | ICD-10-CM | POA: Insufficient documentation

## 2022-06-03 LAB — POCT URINALYSIS DIPSTICK, ED / UC
Glucose, UA: NEGATIVE mg/dL
Hgb urine dipstick: NEGATIVE
Ketones, ur: NEGATIVE mg/dL
Leukocytes,Ua: NEGATIVE
Nitrite: NEGATIVE
Protein, ur: 30 mg/dL — AB
Specific Gravity, Urine: >=1.03 (ref 1.005–1.030)
Urobilinogen, UA: 0.2 mg/dL (ref 0.0–1.0)
pH: 5.5 (ref 5.0–8.0)

## 2022-06-03 MED ORDER — ONDANSETRON 4 MG PO TBDP
4.0000 mg | ORAL_TABLET | Freq: Once | ORAL | Status: AC
  Administered 2022-06-03: 4 mg via ORAL

## 2022-06-03 MED ORDER — CEFTRIAXONE SODIUM 500 MG IJ SOLR
500.0000 mg | Freq: Once | INTRAMUSCULAR | Status: AC
  Administered 2022-06-03: 500 mg via INTRAMUSCULAR

## 2022-06-03 MED ORDER — DOXYCYCLINE HYCLATE 100 MG PO CAPS: 100.0000 mg | ORAL_CAPSULE | Freq: Two times a day (BID) | ORAL | 0 refills | Status: AC

## 2022-06-03 MED ORDER — CEFTRIAXONE SODIUM 500 MG IJ SOLR
INTRAMUSCULAR | Status: AC
  Filled 2022-06-03: qty 500

## 2022-06-03 MED ORDER — ACETAMINOPHEN 500 MG PO TABS: 1000.0000 mg | ORAL_TABLET | Freq: Four times a day (QID) | ORAL | 0 refills | Status: DC | PRN

## 2022-06-03 MED ORDER — LIDOCAINE HCL (PF) 1 % IJ SOLN
INTRAMUSCULAR | Status: AC
  Filled 2022-06-03: qty 2

## 2022-06-03 MED ORDER — ONDANSETRON 4 MG PO TBDP: 4.0000 mg | ORAL_TABLET | Freq: Three times a day (TID) | ORAL | 0 refills | Status: DC | PRN

## 2022-06-03 MED ORDER — GUAIFENESIN ER 600 MG PO TB12: 1200.0000 mg | ORAL_TABLET | Freq: Two times a day (BID) | ORAL | 0 refills | Status: DC

## 2022-06-03 MED ORDER — ONDANSETRON 4 MG PO TBDP
ORAL_TABLET | ORAL | Status: AC
  Filled 2022-06-03: qty 1

## 2022-06-03 NOTE — ED Provider Notes (Signed)
[facility]    CSN: [id number] Arrival date & time: 06/03/22  0920      History   Chief Complaint Chief Complaint  Patient presents with   Abdominal Pain   Diarrhea   Penile Discharge   Appointment    1000    HPI [name] [name] is a 31 y.o. male.   Patient presents to urgent care for evaluation of abdominal pain, diarrhea, headache, diaphoresis, and vomiting since Tuesday June 01, 2022. He went to a cookout on Monday and ate a hamburger that he believes was fully cooked. He denies eating food that was sitting outside for a long period of time and eating anything outside of his normal diet. Diarrhea is watery, brown, and clay colored intermittently. Sometimes diarrhea is "milky". Reports drinking out of a water hose in the back yard on Sunday May 30, 2022. He has vomited 4 times in the last 24 hours. Unable to quantify number of times he has had diarrhea in the last 24 hours. Reports decreased appetite and fatigue over the last 3 days, but reports that he feels like he has a temporary burst of energy. He is currently nauseous. Abdominal pain is to the right and left lower quadrants and mostly prior to needing to go to the bathroom to have diarrhea. He denies abdominal pain at this time. He has not attempted use of over the counter medications for symptoms. He states he feels better after eating a small amount of food and drinking water, but he vomits not long after eating. Reports feeling "hot and cold" intermittently. Denies recent antibiotic use or illness. No sick contacts. Everyone else at the cookout who ate the food is not experiencing similar symptoms.   He also states that he is having pain in his throat and nose since symptoms started 3 days ago. Reports clear nasal congestion and coughing up "greenish-brown" phlegm. No shortness of breath, chest pain, and weakness. Denies history of asthma. He smokes tobacco and marijuana daily. He drinks 10 mike's hard alcoholic beverages  per week. He has not been drinking these past 3 days and denies tremor.   He is also reporting some yellow penile discharge that started a few days ago.  He reports recent unprotected sexual intercourse and believes that he may have contracted an STI.  Declines HIV and RPR testing today.  Denies penile rash, urinary symptoms, and pain.   Abdominal Pain Associated symptoms: diarrhea   Diarrhea Associated symptoms: abdominal pain   Penile Discharge Associated symptoms include abdominal pain.    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 Authorizing Provider  acetaminophen (TYLENOL) 500 MG tablet Take 2 tablets (1,000 mg total) by mouth every 6 (six) hours as needed. 06/03/22  Yes Carlisle Beers, FNP  doxycycline (VIBRAMYCIN) 100 MG capsule Take 1 capsule (100 mg total) by mouth 2 (two)  times daily for 7 days. 06/03/22 06/10/22 Yes Inara Dike, Donavan Burnet, FNP  guaiFENesin (MUCINEX) 600 MG 12 hr tablet Take 2 tablets (1,200 mg total) by mouth 2 (two) times daily. 06/03/22  Yes Carlisle Beers, FNP  ondansetron (ZOFRAN-ODT) 4 MG disintegrating tablet Take 1 tablet (4 mg total) by mouth every 8 (eight) hours as needed for nausea or vomiting. 06/03/22  Yes Crista Nuon, Donavan Burnet, FNP    Family History Family History  Problem Relation Age of Onset   Healthy Mother     Social History Social History   Tobacco Use   Smoking status: Every Day    Packs/day: 0.50     Types: Cigarettes   Smokeless tobacco: Never  Vaping Use   Vaping Use: Never used  Substance Use Topics   Alcohol use: Yes    Alcohol/week: 10.0 standard drinks of alcohol    Types: 10 Standard drinks or equivalent per week   Drug use: Yes    Types: Marijuana     Allergies   Patient has no known allergies.   Review of Systems Review of Systems  Gastrointestinal:  Positive for abdominal pain and diarrhea.  Genitourinary:  Positive for penile discharge.  Per HPI   Physical Exam Triage Vital Signs ED Triage Vitals  Enc Vitals Group     BP 06/03/22 0953 124/81     Pulse Rate 06/03/22 0953 74     Resp 06/03/22 0953 18     Temp 06/03/22 0953 98 F (36.7 C)     Temp Source 06/03/22 0953 Oral     SpO2 06/03/22 0953 100 %     Weight --      Height --      Head Circumference --      Peak Flow --      Pain Score 06/03/22 0955 0     Pain Loc --      Pain Edu? --      Excl. in GC? --    No data found.  Updated Vital Signs BP 124/81   Pulse 74   Temp 98 F (36.7 C) (Oral)   Resp 18   SpO2 100%   Visual Acuity Right Eye Distance:   Left Eye Distance:   Bilateral Distance:    Right Eye Near:   Left Eye Near:    Bilateral Near:     Physical Exam Vitals and nursing note reviewed.  Constitutional:      Appearance: Normal appearance. He is normal weight. He is not ill-appearing or toxic-appearing.     Comments: Very pleasant patient sitting on exam in position of comfort table in no acute distress.   HENT:     Head: Normocephalic and atraumatic.     Right Ear: Hearing, tympanic membrane, ear canal and external ear normal.     Left Ear: Hearing, tympanic membrane, ear canal and external ear normal.     Nose: Nose normal.     Mouth/Throat:     Lips: Pink.     Mouth: Mucous membranes are moist.     Pharynx: No posterior oropharyngeal erythema.  Eyes:     General: Lids are normal. Vision grossly intact. Gaze aligned appropriately.     Extraocular Movements:  Extraocular movements intact.     Conjunctiva/sclera: Conjunctivae normal.     Right eye: Right conjunctiva is not injected.     Left eye: Left conjunctiva is not injected.     Pupils: Pupils are equal, round, and reactive to light.  Cardiovascular:     Rate and Rhythm: Normal rate and regular rhythm.     Heart sounds: Normal heart sounds, S1 normal and S2 normal.  Pulmonary:     Effort: Pulmonary effort is normal. No respiratory distress.     Breath sounds: Normal breath sounds and air entry.  Abdominal:     General: Abdomen is flat. Bowel sounds are normal.     Palpations: Abdomen is soft.     Tenderness: There is no abdominal tenderness. There is no right CVA tenderness, left CVA tenderness or guarding. Negative signs include Murphy's sign and McBurney's sign.     Comments: No peritoneal signs elicited with physical exam.   Musculoskeletal:     Cervical back: Neck supple.  Lymphadenopathy:     Cervical: Cervical adenopathy present.  Skin:    General: Skin is warm and dry.     Capillary Refill: Capillary refill takes less than 2 seconds.     Findings: No rash.     Comments: Skin turgor normal.  Neurological:     General: No focal deficit present.     Mental Status: He is alert and oriented to person, place, and time. Mental status is at baseline.     Cranial Nerves: Cranial nerves 2-12 are intact. No dysarthria or facial asymmetry.     Sensory: Sensation is intact.     Motor: Motor function is intact.     Coordination: Coordination is intact.     Gait: Gait is intact.  Psychiatric:        Mood and Affect: Mood normal.        Speech: Speech normal.        Behavior: Behavior normal.        Thought Content: Thought content normal.        Judgment: Judgment normal.      UC Treatments / Results  Labs (all labs ordered are listed, but only abnormal results are displayed) Labs Reviewed  POCT URINALYSIS DIPSTICK, ED / UC - Abnormal; Notable for the following components:       Result Value   Bilirubin Urine SMALL (*)    Protein, ur 30 (*)    All other components within normal limits  SARS CORONAVIRUS 2 (TAT 6-24 HRS)  GASTROINTESTINAL PANEL BY PCR, STOOL (REPLACES STOOL CULTURE)  CYTOLOGY, (ORAL, ANAL, URETHRAL) ANCILLARY ONLY    EKG   Radiology No results found.  Procedures Procedures (including critical care time)  Medications Ordered in UC Medications  ondansetron (ZOFRAN-ODT) disintegrating tablet 4 mg (4 mg Oral Given 06/03/22 1020)  cefTRIAXone (ROCEPHIN) injection 500 mg (500 mg Intramuscular Given 06/03/22 1156)    Initial Impression / Assessment and Plan / UC Course  I have reviewed the triage vital signs and the nursing notes.  Pertinent labs & imaging results that were available during my care of the patient were reviewed by me and considered in my medical decision making (see chart for details).   1. Acute cough Symptomology and physical exam are consistent with viral upper respiratory infection with cough and nasal congestion. Deferred imaging based on stable cardiopulmonary exam and vital signs in the clinic. Plan to treat with supportive care prescription for guaifenesin twice daily to thin mucous. Patient to increase water intake as tolerated to at least 64 ounces of water per day. Suspect this is self-limiting and will resolve in the next 4-5 days. Patient to return to urgent care if no improvement in 1 week for reevaluation.   2. Nausea,  vomiting, and diarrhea Stool sample sent for evaluation. Nausea treated in clinic with zofran 4mg  ODT with significant improvement. Patient able to tolerate liquids and crackers without nausea and vomiting prior to discharge from urgent care. Symptoms are likely viral in nature versus bacterial. Plan to treat with supportive care prescription for zofran 4mg  ODT to be taken every 8 hours at home for nausea and vomiting. BRAT diet recommended for the next 12-24 hours, then increase as tolerated to normal  diet. Patient to increase water intake to at least 64 ounces of water per day as stated above to prevent dehydration. He appears mildly dehydrated to physical exam today, but there is no clinical indication for IV rehydration at this time based on stable vital signs and he is tolerating PO fluid at this this time after zofran in the clinic. COVID-19 testing pending and will result in the next 2-3 days.   3. Penile discharge Plan to treat empirically for gonorrhea with 500mg  injection ceftriaxone in clinic and for chlamydia with doxycycline twice daily for the next 7 days. He denies allergies to antibiotics. Abstinence from sexual intercourse advised for 7 days to prevent spread of STIs to partners. STI testing pending. Will treat per protocol for other STIs detected. Patient declines HIV and RPR testing today.   Discussed physical exam and available lab work findings in clinic with patient.  Counseled patient regarding appropriate use of medications and potential side effects for all medications recommended or prescribed today. Discussed red flag signs and symptoms of worsening condition,when to call the PCP office, return to urgent care, and when to seek higher level of care in the emergency department. Patient verbalizes understanding and agreement with plan. All questions answered. Patient discharged in stable condition.  Final Clinical Impressions(s) / UC Diagnoses   Final diagnoses:  Encounter for laboratory testing for COVID-19 virus  Nausea vomiting and diarrhea  Penile discharge  Acute cough     Discharge Instructions      STI testing is pending and will result in the next couple of days.  We gave you a shot in the clinic to treat you for possible gonorrhea. Pick up doxycycline from the pharmacy if your chlamdyia test comes back positive. Do not take this until the result of your STI testing.  Avoid sexual intercourse until your results of your STI testing comes back.  If anything is  positive, avoid sexual intercourse for 1 week while you are being treated to prevent spread of STIs.  You likely have a virus that is causing your nausea, vomiting, and diarrhea.  This virus is also likely causing your cough and sore throat. We have tested you for COVID-19 today and these results will come back in the next 24 to 48 hours.  Take guaifenesin twice daily to thin your mucus so that you are able to cough it up easier and blow it out of your nose.  Take Tylenol 1000 mg every 6 hours as needed for fever/chills, and pain.  Take Zofran every 8 hours as needed for nausea and vomiting.  Your next dose of Zofran may be at 7 PM tonight since you were given some in the clinic today.  Eat a bland diet for the next 12 to 24 hours and increase your diet as tolerated.  Bland meaning toast, applesauce, bananas, and rice.   We sent your stool for testing to make sure that there is no abnormal bacteria.  Results of this will come back in the next few  days.  We will give you a call if anything comes back positive.  You are very dehydrated today.  Drink at least 64 ounces of water per day to stay well-hydrated.  Cut back on the amount of alcohol that you are drinking as this can cause further dehydration.  If you develop any new or worsening symptoms or do not improve in the next 2 to 3 days, please return.  If your symptoms are severe, please go to the emergency room.  Follow-up with your primary care provider for further evaluation and management of your symptoms as well as ongoing wellness visits.  I hope you feel better!     ED Prescriptions     Medication Sig Dispense Auth. Provider   doxycycline (VIBRAMYCIN) 100 MG capsule Take 1 capsule (100 mg total) by mouth 2 (two) times daily for 7 days. 14 capsule Reita MayStanhope, Anderson Coppock M, FNP   ondansetron (ZOFRAN-ODT) 4 MG disintegrating tablet Take 1 tablet (4 mg total) by mouth every 8 (eight) hours as needed for nausea or vomiting. 20 tablet Reita MayStanhope,  Analie Katzman M, FNP   acetaminophen (TYLENOL) 500 MG tablet Take 2 tablets (1,000 mg total) by mouth every 6 (six) hours as needed. 30 tablet Carlisle BeersStanhope, Shelagh Rayman M, FNP   guaiFENesin (MUCINEX) 600 MG 12 hr tablet Take 2 tablets (1,200 mg total) by mouth 2 (two) times daily. 30 tablet Carlisle BeersStanhope, Andrw Mcguirt M, FNP      PDMP not reviewed this encounter.   Carlisle BeersStanhope, Jerrik Housholder M, [place]NP 06/06/22 1243

## 2022-06-03 NOTE — ED Notes (Signed)
Pt states nausea resolved. PO fluids provided. Updated on wait; comfort measures offered.

## 2022-06-03 NOTE — Discharge Instructions (Signed)
STI testing is pending and will result in the next couple of days.  We gave you a shot in the clinic to treat you for possible gonorrhea. Pick up doxycycline from the pharmacy if your chlamdyia test comes back positive. Do not take this until the result of your STI testing.  Avoid sexual intercourse until your results of your STI testing comes back.  If anything is positive, avoid sexual intercourse for 1 week while you are being treated to prevent spread of STIs.  You likely have a virus that is causing your nausea, vomiting, and diarrhea.  This virus is also likely causing your cough and sore throat. We have tested you for COVID-19 today and these results will come back in the next 24 to 48 hours.  Take guaifenesin twice daily to thin your mucus so that you are able to cough it up easier and blow it out of your nose.  Take Tylenol 1000 mg every 6 hours as needed for fever/chills, and pain.  Take Zofran every 8 hours as needed for nausea and vomiting.  Your next dose of Zofran may be at 7 PM tonight since you were given some in the clinic today.  Eat a bland diet for the next 12 to 24 hours and increase your diet as tolerated.  Bland meaning toast, applesauce, bananas, and rice.   We sent your stool for testing to make sure that there is no abnormal bacteria.  Results of this will come back in the next few days.  We will give you a call if anything comes back positive.  You are very dehydrated today.  Drink at least 64 ounces of water per day to stay well-hydrated.  Cut back on the amount of alcohol that you are drinking as this can cause further dehydration.  If you develop any new or worsening symptoms or do not improve in the next 2 to 3 days, please return.  If your symptoms are severe, please go to the emergency room.  Follow-up with your primary care provider for further evaluation and management of your symptoms as well as ongoing wellness visits.  I hope you feel better!

## 2022-06-03 NOTE — ED Triage Notes (Signed)
C/O intermittent mid-abd pain with watery diarrhea, n/v, cough, runny nose, chills, fatigue onset Monday morning. States able to keep down some PO food and fluids. Also reports slight penile discharge and dysuria. Requesting STD check & Covid test.

## 2022-06-04 ENCOUNTER — Encounter (HOSPITAL_COMMUNITY): Payer: Self-pay

## 2022-06-04 ENCOUNTER — Telehealth (HOSPITAL_COMMUNITY): Payer: Self-pay | Admitting: Emergency Medicine

## 2022-06-04 LAB — GASTROINTESTINAL PANEL BY PCR, STOOL (REPLACES STOOL CULTURE)

## 2022-06-04 LAB — CYTOLOGY, (ORAL, ANAL, URETHRAL) ANCILLARY ONLY
Chlamydia: POSITIVE — AB
Comment: NEGATIVE
Comment: NEGATIVE
Comment: NORMAL
Neisseria Gonorrhea: NEGATIVE
Trichomonas: POSITIVE — AB

## 2022-06-04 LAB — SARS CORONAVIRUS 2 (TAT 6-24 HRS): SARS Coronavirus 2: NEGATIVE

## 2022-06-04 MED ORDER — METRONIDAZOLE 500 MG PO TABS: 2000.0000 mg | ORAL_TABLET | Freq: Once | ORAL | 0 refills | Status: AC

## 2022-09-30 ENCOUNTER — Emergency Department (HOSPITAL_COMMUNITY): Payer: Self-pay

## 2022-09-30 ENCOUNTER — Emergency Department (HOSPITAL_COMMUNITY)
Admission: EM | Admit: 2022-09-30 | Discharge: 2022-09-30 | Discharge status: Against Medical Advice | Payer: Self-pay | Attending: Emergency Medicine | Admitting: Emergency Medicine

## 2022-09-30 ENCOUNTER — Encounter (HOSPITAL_COMMUNITY): Payer: Self-pay | Admitting: Emergency Medicine

## 2022-09-30 ENCOUNTER — Other Ambulatory Visit: Payer: Self-pay

## 2022-09-30 DIAGNOSIS — Z5321 Procedure and treatment not carried out due to patient leaving prior to being seen by health care provider: Secondary | ICD-10-CM | POA: Insufficient documentation

## 2022-09-30 DIAGNOSIS — M25531 Pain in right wrist: Secondary | ICD-10-CM | POA: Insufficient documentation

## 2022-09-30 DIAGNOSIS — M79641 Pain in right hand: Secondary | ICD-10-CM | POA: Insufficient documentation

## 2022-09-30 DIAGNOSIS — Y990 Civilian activity done for income or pay: Secondary | ICD-10-CM | POA: Insufficient documentation

## 2022-09-30 NOTE — ED Provider Triage Note (Signed)
Emergency Medicine Provider Triage Evaluation Note  [name] [name] , a 31 y.o. male  was evaluated in triage.  Pt complains of R wrist/hand pain. Started suddenly after flipping a fry basket. States no trauma to his arm. Denies any weakness but severe pain when extending at the wrist. No numbness.  Review of Systems  Positive: Wrist pain Negative: Fever   Physical Exam  BP 138/67 (BP Location: Left Arm)   Pulse (!) 55   Temp 98.3 F (36.8 C)   Resp 14   SpO2 99%  Gen:   Awake, no distress   Resp:  Normal effort  MSK:   Moves extremities without difficulty  Other:  Able to flex/extend at wrist but discomfort w extension. No bony TTP  Medical Decision Making  Medically screening exam initiated at 11:29 AM.  Appropriate orders placed.  Hilma Favors Ghanem was informed that the remainder of the evaluation will be completed by another provider, this initial triage assessment does not replace that evaluation, and the importance of remaining in the ED until their evaluation is complete.    [name] [name] [place], [place] 09/30/22 1132

## 2022-09-30 NOTE — ED Triage Notes (Signed)
Pt reports right wrist and hand pain after dropping a fryer basket while at work. PT reports trouble moving his 4th and 5th fingers.

## 2023-03-14 ENCOUNTER — Other Ambulatory Visit (HOSPITAL_COMMUNITY): Payer: Self-pay

## 2023-03-14 MED ORDER — METRONIDAZOLE 500 MG PO TABS
2000.0000 mg | ORAL_TABLET | ORAL | 0 refills | Status: DC
  Filled 2023-03-14: qty 4, 1d supply, fill #0

## 2023-04-13 ENCOUNTER — Other Ambulatory Visit (HOSPITAL_COMMUNITY): Payer: Self-pay

## 2023-04-13 MED ORDER — METRONIDAZOLE 500 MG PO TABS
2000.0000 mg | ORAL_TABLET | Freq: Once | ORAL | 0 refills | Status: AC
Start: 2023-04-13 — End: 2023-04-14
  Filled 2023-04-13: qty 4, 1d supply, fill #0

## 2023-10-28 ENCOUNTER — Ambulatory Visit (HOSPITAL_COMMUNITY): Payer: Self-pay

## 2023-11-02 ENCOUNTER — Ambulatory Visit (HOSPITAL_COMMUNITY)
Admission: EM | Admit: 2023-11-02 | Discharge: 2023-11-02 | Disposition: A | Discharge status: Home / Self Care | Payer: Self-pay | Attending: Nurse Practitioner | Admitting: Nurse Practitioner

## 2023-11-02 ENCOUNTER — Inpatient Hospital Stay (HOSPITAL_COMMUNITY)
Admission: RE | Admit: 2023-11-02 | Discharge: 2023-11-02 | Disposition: A | Discharge status: Home / Self Care | Payer: Self-pay | Source: Ambulatory Visit

## 2023-11-02 ENCOUNTER — Encounter (HOSPITAL_COMMUNITY): Payer: Self-pay | Admitting: Emergency Medicine

## 2023-11-02 ENCOUNTER — Other Ambulatory Visit: Payer: Self-pay

## 2023-11-02 DIAGNOSIS — Z113 Encounter for screening for infections with a predominantly sexual mode of transmission: Secondary | ICD-10-CM | POA: Insufficient documentation

## 2023-11-02 DIAGNOSIS — Z202 Contact with and (suspected) exposure to infections with a predominantly sexual mode of transmission: Secondary | ICD-10-CM | POA: Insufficient documentation

## 2023-11-02 LAB — HIV ANTIBODY (ROUTINE TESTING W REFLEX): HIV Screen 4th Generation wRfx: NONREACTIVE

## 2023-11-02 MED ORDER — METRONIDAZOLE 500 MG PO TABS: 2000.0000 mg | ORAL_TABLET | Freq: Once | ORAL | 0 refills | Status: AC

## 2023-11-02 NOTE — Discharge Instructions (Signed)
Take the metronidazole as prescribed to treat trichomonas.  It is 4 tablets 1 time.  We will contact you if any of the other testing from today comes back abnormal.  Recommend condom use with every sexual encounter to prevent STI in the future.

## 2023-11-02 NOTE — ED Provider Notes (Signed)
[facility]    CSN: [id number] Arrival date & time: 11/02/23  1551      History   Chief Complaint Chief Complaint  Patient presents with   Exposure to STD    Pt requesting to be check for STD.    HPI [name] [name] is a 32 y.o. male.   Patient presents today with concern for partner testing positive for trichomonas.  He thinks he has been exposed to trichomonas.  He denies any symptoms including no penile discharge, burning with urination, testicle or scrotal pain, penile rashes, sores, or lesions, pelvic pain, swelling in the groin, abdominal pain, nausea/vomiting, and fever.  He is requesting treatment for trichomonas today as well as testing for all other STIs.  Denies history of STIs.    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 Authorizing Provider  metroNIDAZOLE (FLAGYL) 500 MG tablet Take 4 tablets (2,000 mg total) by mouth once for 1 dose. 11/02/23 11/02/23 Yes Valentino Nose, NP  acetaminophen (TYLENOL) 500 MG tablet Take 2 tablets (1,000 mg total) by mouth every 6 (six) hours as needed. 06/03/22   Carlisle Beers, FNP  guaiFENesin (MUCINEX) 600 MG 12 hr tablet Take 2 tablets (1,200 mg total) by mouth 2 (two) times daily. 06/03/22   Carlisle Beers, FNP  ondansetron (ZOFRAN-ODT) 4 MG disintegrating tablet Take 1 tablet (4 mg total) by mouth every 8 (eight) hours as needed for nausea or vomiting. 06/03/22   Carlisle Beers,  FNP    Family History Family History  Problem Relation Age of Onset   Healthy Mother     Social History Social History   Tobacco Use   Smoking status: Every Day    Current packs/day: 0.50    Types: Cigarettes   Smokeless tobacco: Never  Vaping Use   Vaping status: Never Used  Substance Use Topics   Alcohol use: Yes    Alcohol/week: 10.0 standard drinks of alcohol    Types: 10 Standard drinks or equivalent per week   Drug use: Yes    Types: Marijuana     Allergies   Patient has no known allergies.   Review of Systems Review of Systems Per HPI  Physical Exam Triage Vital Signs ED Triage Vitals  Encounter Vitals Group     BP 11/02/23 1604 131/75     Systolic BP Percentile --      Diastolic BP Percentile --      Pulse Rate 11/02/23 1604 82     Resp 11/02/23 1604 16     Temp 11/02/23 1604 98.2 F (36.8 C)     Temp Source 11/02/23 1604 Oral     SpO2 11/02/23 1604 100 %     Weight --      Height --      Head Circumference --      Peak  Flow --      Pain Score 11/02/23 1606 0     Pain Loc --      Pain Education --      Exclude from Growth Chart --    No data found.  Updated Vital Signs BP 131/75 (BP Location: Right Arm)   Pulse 82   Temp 98.2 F (36.8 C) (Oral)   Resp 16   SpO2 100%   Visual Acuity Right Eye Distance:   Left Eye Distance:   Bilateral Distance:    Right Eye Near:   Left Eye Near:    Bilateral Near:     Physical Exam Vitals and nursing note reviewed.  Constitutional:      General: He is not in acute distress.    Appearance: Normal appearance. He is not toxic-appearing.  Pulmonary:     Effort: Pulmonary effort is normal. No respiratory distress.  Genitourinary:    Comments: Deferred - self swab performed by patient Skin:    General: Skin is warm and dry.     Capillary Refill: Capillary refill takes less than 2 seconds.     Coloration: Skin is not jaundiced or pale.  Neurological:     Mental Status: He is alert and  oriented to person, place, and time.     Motor: No weakness.     Gait: Gait normal.  Psychiatric:        Behavior: Behavior is cooperative.      UC Treatments / Results  Labs (all labs ordered are listed, but only abnormal results are displayed) Labs Reviewed  HIV ANTIBODY (ROUTINE TESTING W REFLEX)  RPR  CYTOLOGY, (ORAL, ANAL, URETHRAL) ANCILLARY ONLY    EKG   Radiology No results found.  Procedures Procedures (including critical care time)  Medications Ordered in UC Medications - No data to display  Initial Impression / Assessment and Plan / UC Course  I have reviewed the triage vital signs and the nursing notes.  Pertinent labs & imaging results that were available during my care of the patient were reviewed by me and considered in my medical decision making (see chart for details).   Patient is well-appearing, normotensive, afebrile, not tachycardic, not tachypneic, oxygenating well on room air.    1. Exposure to trichomonas 2. Screening examination for STD (sexually transmitted disease) Given known exposure to trichomonas, will treat with metronidazole 2 g once Penile cytology pending for gonorrhea, chlamydia-treat as indicated HIV and syphilis testing also obtained Recommended condoms with every sexual encounter and condoms offered, but patient declines today  The patient was given the opportunity to ask questions.  All questions answered to their satisfaction.  The patient is in agreement to this plan.    Final Clinical Impressions(s) / UC Diagnoses   Final diagnoses:  Exposure to trichomonas  Screening examination for STD (sexually transmitted disease)     Discharge Instructions      Take the metronidazole as prescribed to treat trichomonas.  It is 4 tablets 1 time.  We will contact you if any of the other testing from today comes back abnormal.  Recommend condom use with every sexual encounter to prevent STI in the future.    ED Prescriptions      Medication Sig Dispense Auth. Provider   metroNIDAZOLE (FLAGYL) 500 MG tablet Take 4 tablets (2,000 mg total) by mouth once for 1 dose. 4 tablet Valentino Nose, NP      PDMP not reviewed this encounter.   Valentino Nose, NP  11/02/23 1632  

## 2023-11-02 NOTE — ED Triage Notes (Signed)
Pt requesting to be check for STD.

## 2023-11-03 LAB — RPR: RPR Ser Ql: NONREACTIVE

## 2023-11-04 LAB — CYTOLOGY, (ORAL, ANAL, URETHRAL) ANCILLARY ONLY
Chlamydia: NEGATIVE
Comment: NEGATIVE
Comment: NEGATIVE
Comment: NORMAL
Neisseria Gonorrhea: NEGATIVE
Trichomonas: POSITIVE — AB

## 2024-01-11 ENCOUNTER — Ambulatory Visit (HOSPITAL_COMMUNITY): Payer: Self-pay

## 2024-01-21 ENCOUNTER — Ambulatory Visit (HOSPITAL_COMMUNITY)
Admission: RE | Admit: 2024-01-21 | Discharge: 2024-01-21 | Disposition: A | Discharge status: Home / Self Care | Payer: Self-pay | Source: Ambulatory Visit | Attending: Emergency Medicine | Admitting: Emergency Medicine

## 2024-01-21 ENCOUNTER — Encounter (HOSPITAL_COMMUNITY): Payer: Self-pay

## 2024-01-21 VITALS — BP 133/97 | HR 97 | Temp 98.9°F | Resp 16 | Ht 72.0 in | Wt 240.0 lb

## 2024-01-21 DIAGNOSIS — Z113 Encounter for screening for infections with a predominantly sexual mode of transmission: Secondary | ICD-10-CM | POA: Insufficient documentation

## 2024-01-21 DIAGNOSIS — Z202 Contact with and (suspected) exposure to infections with a predominantly sexual mode of transmission: Secondary | ICD-10-CM | POA: Insufficient documentation

## 2024-01-21 LAB — HIV ANTIBODY (ROUTINE TESTING W REFLEX): HIV Screen 4th Generation wRfx: NONREACTIVE

## 2024-01-21 MED ORDER — METRONIDAZOLE 500 MG PO TABS: 500.0000 mg | ORAL_TABLET | Freq: Two times a day (BID) | ORAL | 0 refills | Status: DC

## 2024-01-21 NOTE — Discharge Instructions (Signed)
Start taking Flagyl twice daily for 7 days. Your results will come back over the next few days and someone will call if results are positive and require treatment.  Return here as needed.

## 2024-01-21 NOTE — ED Provider Notes (Signed)
[facility]    CSN: [id number] Arrival date & time: 01/21/24  1336      History   Chief Complaint Chief Complaint  Patient presents with   Exposure to STD    Entered by patient    HPI [name] [name] is a 33 y.o. male.   Patient presents for STD testing.  Patient states that a recent partner tested positive for trichomonas.  Denies any symptoms.   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 Authorizing Provider  metroNIDAZOLE (FLAGYL) 500 MG tablet Take 1 tablet (500 mg total) by mouth 2 (two) times daily. 01/21/24  Yes Letta Kocher, NP    Family History Family History  Problem Relation Age of Onset   Healthy Mother     Social History Social History   Tobacco Use   Smoking status: Every Day    Current packs/day: 0.50    Types: Cigarettes   Smokeless tobacco: Never  Vaping Use   Vaping status: Never Used  Substance Use Topics   Alcohol use: Yes    Alcohol/week: 10.0 standard drinks of alcohol    Types: 10 Standard drinks or equivalent per week   Drug use: Yes    Types: Marijuana     Allergies   Patient has no known allergies.   Review of Systems Review of Systems  Per HPI  Physical Exam Triage Vital Signs ED Triage Vitals [01/21/24 1414]  Encounter Vitals Group     BP (!) 133/97     Systolic BP Percentile      Diastolic BP Percentile      Pulse Rate 97     Resp 16     Temp 98.9 F (37.2  C)     Temp Source Oral     SpO2 95 %     Weight 240 lb (108.9 kg)     Height 6' (1.829 m)     Head Circumference      Peak Flow      Pain Score 0     Pain Loc      Pain Education      Exclude from Growth Chart    No data found.  Updated Vital Signs BP (!) 133/97 (BP Location: Right Arm)   Pulse 97   Temp 98.9 F (37.2 C) (Oral)   Resp 16   Ht 6' (1.829 m)   Wt 240 lb (108.9 kg)   SpO2 95%   BMI 32.55 kg/m   Visual Acuity Right Eye Distance:   Left Eye Distance:   Bilateral Distance:    Right Eye Near:   Left Eye Near:    Bilateral Near:     Physical Exam Vitals and nursing note reviewed.  Constitutional:      General: He is awake. He is not in acute distress.  Appearance: Normal appearance. He is well-developed and well-groomed. He is not ill-appearing.  Genitourinary:    Comments: Exam deferred. Neurological:     Mental Status: He is alert.  Psychiatric:        Behavior: Behavior is cooperative.      UC Treatments / Results  Labs (all labs ordered are listed, but only abnormal results are displayed) Labs Reviewed  HIV ANTIBODY (ROUTINE TESTING W REFLEX)  RPR  CYTOLOGY, (ORAL, ANAL, URETHRAL) ANCILLARY ONLY    EKG   Radiology No results found.  Procedures Procedures (including critical care time)  Medications Ordered in UC Medications - No data to display  Initial Impression / Assessment and Plan / UC Course  I have reviewed the triage vital signs and the nursing notes.  Pertinent labs & imaging results that were available during my care of the patient were reviewed by me and considered in my medical decision making (see chart for details).     Patient presented for STD testing.  Patient reports recent exposure to trichomonas.  Denies any symptoms.  No significant findings upon assessment.  GU exam deferred.  Patient performed self swab for STD.  HIV and syphilis testing ordered.  Prescribed Flagyl for trichomonas coverage.   Discussed follow-up and return precautions. Final Clinical Impressions(s) / UC Diagnoses   Final diagnoses:  STD exposure  Screen for STD (sexually transmitted disease)     Discharge Instructions      Start taking Flagyl twice daily for 7 days. Your results will come back over the next few days and someone will call if results are positive and require treatment.  Return here as needed.     ED Prescriptions     Medication Sig Dispense Auth. Provider   metroNIDAZOLE (FLAGYL) 500 MG tablet Take 1 tablet (500 mg total) by mouth 2 (two) times daily. 14 tablet Wynonia Lawman A, NP      PDMP not reviewed this encounter.   Wynonia Lawman A, NP 01/21/24 1531

## 2024-01-21 NOTE — ED Triage Notes (Signed)
Patient here today after being told that his partner tested positive for Trich. Patient is not having any symptoms.

## 2024-01-22 LAB — RPR: RPR Ser Ql: NONREACTIVE

## 2024-01-26 LAB — CYTOLOGY, (ORAL, ANAL, URETHRAL) ANCILLARY ONLY
Chlamydia: NEGATIVE
Comment: NEGATIVE
Comment: NEGATIVE
Comment: NORMAL
Neisseria Gonorrhea: NEGATIVE
Trichomonas: POSITIVE — AB

## 2024-03-08 ENCOUNTER — Encounter (HOSPITAL_COMMUNITY): Payer: Self-pay

## 2024-03-08 ENCOUNTER — Ambulatory Visit (HOSPITAL_COMMUNITY)
Admission: EM | Admit: 2024-03-08 | Discharge: 2024-03-08 | Disposition: A | Discharge status: Home / Self Care | Payer: Self-pay

## 2024-03-08 DIAGNOSIS — K047 Periapical abscess without sinus: Secondary | ICD-10-CM

## 2024-03-08 MED ORDER — DICLOFENAC SODIUM 75 MG PO TBEC: 75.0000 mg | DELAYED_RELEASE_TABLET | Freq: Two times a day (BID) | ORAL | 0 refills | Status: AC

## 2024-03-08 MED ORDER — AMOXICILLIN-POT CLAVULANATE 875-125 MG PO TABS: 1.0000 | ORAL_TABLET | Freq: Two times a day (BID) | ORAL | 0 refills | Status: AC

## 2024-03-08 NOTE — ED Triage Notes (Signed)
 Patient presenting with lower right side dental pain onset 2 days ago. Patient states he does have a hole in the tooth there. Prescriptions or OTC medications tried: Yes- Ibuprofen and tylenol     with little relief

## 2024-03-08 NOTE — ED Provider Notes (Signed)
 [facility]  Note:  This document was prepared using Dragon voice recognition software and may include unintentional dictation errors.  MRN: [id number] DOB: 1991-11-13  Subjective:   [name] [name] is a 33 y.o. male presenting for right lower dental pain and swelling x 2 days.  Patient reports that he has a broken tooth that he is waiting to see a dentist in 2 weeks for evaluation and management.  Patient reports for the last 2 nights he has not been able to sleep has been taking ibuprofen and Tylenol with no improvement to symptoms.  Patient reports that right lower jaw is throbbing and has redness and swelling to the gingiva.  No fever, shortness of breath, chest pain, weakness, dizziness.  No current facility-administered medications for this encounter.  Current Outpatient Medications:    amoxicillin-clavulanate (AUGMENTIN) 875-125 MG tablet, Take 1 tablet by mouth every 12 (twelve) hours for 10 days., Disp: 20 tablet, Rfl: 0   diclofenac (VOLTAREN) 75 MG EC tablet, Take 1 tablet (75 mg total) by mouth 2 (two) times daily., Disp: 30 tablet, Rfl: 0   No Known Allergies  Past Medical History:  Diagnosis Date   Diabetes (HCC)    Heart defect    Stroke [facility])      Past Surgical History:  Procedure Laterality Date   CARDIAC SURGERY      Family History  Problem Relation Age of Onset   Healthy Mother     Social History   Tobacco Use   Smoking status: Every Day    Current packs/day: 0.50    Types: Cigarettes   Smokeless tobacco: Never  Vaping Use   Vaping status: Never Used  Substance Use Topics   Alcohol use: Yes    Alcohol/week: 10.0 standard drinks of alcohol    Types: 10 Standard drinks or equivalent per week   Drug use: Yes    Types: Marijuana    ROS Refer to HPI for ROS details.  Objective:   Vitals: BP 135/86 (BP Location: Left Arm)   Pulse 87   Temp 98.7 F (37.1 C) (Oral)   Resp 18   Ht 6' (1.829 m)   Wt 270 lb (122.5 kg)   SpO2 96%    BMI 36.62 kg/m   Physical Exam Constitutional:      General: He is not in acute distress.    Appearance: Normal appearance. He is not ill-appearing or toxic-appearing.  HENT:     Head: Normocephalic.     Mouth/Throat:     Mouth: Mucous membranes are moist.     Dentition: Abnormal dentition. Dental tenderness, gingival swelling, dental caries and dental abscesses present.   Cardiovascular:     Rate and Rhythm: Normal rate.  Pulmonary:     Effort: Pulmonary effort is normal. No respiratory distress.  Skin:    General: Skin is warm and dry.  Neurological:     General: No focal deficit present.     Mental Status: He is alert and oriented to person, place, and time.  Psychiatric:        Mood and Affect: Mood normal.     Procedures  No results found for this or any previous visit (from the past 24 hours).  Assessment and Plan :   PDMP not reviewed this encounter.  1. Dental infection    1. Dental infection (Primary) - diclofenac (VOLTAREN) 75 MG EC tablet; Take 1 tablet (75 mg total) by mouth 2 (two) times daily.  Dispense: 30 tablet; Refill:  0 - amoxicillin-clavulanate (AUGMENTIN) 875-125 MG tablet; Take 1 tablet by mouth every 12 (twelve) hours for 10 days.  Dispense: 20 tablet; Refill: 0 -Do not take ibuprofen with diclofenac because they have similar mechanism of action.  You may take Tylenol in conjunction with diclofenac without problems. -Wash mouth out with Listerine or scope several times a day to decrease bacterial load to minimize reinfection. -Follow-up with dentist as soon as possible for evaluation and management of broken tooth with dental infection.  -Continue to monitor symptoms for any change in severity if there is any escalation of current symptoms or development of new symptoms follow-up in ER for further evaluation and management.  [name] [name]   [place], [place], [place] 03/08/24 1513

## 2024-03-08 NOTE — Discharge Instructions (Addendum)
 1. Dental infection (Primary) - diclofenac (VOLTAREN) 75 MG EC tablet; Take 1 tablet (75 mg total) by mouth 2 (two) times daily.  Dispense: 30 tablet; Refill: 0 - amoxicillin-clavulanate (AUGMENTIN) 875-125 MG tablet; Take 1 tablet by mouth every 12 (twelve) hours for 10 days.  Dispense: 20 tablet; Refill: 0 -Do not take ibuprofen with diclofenac because they have similar mechanism of action.  You may take Tylenol in conjunction with diclofenac without problems. -Wash mouth out with Listerine or scope several times a day to decrease bacterial load to minimize reinfection. -Follow-up with dentist as soon as possible for evaluation and management of broken tooth with dental infection.

## 2024-06-01 ENCOUNTER — Ambulatory Visit: Payer: Self-pay

## 2024-07-19 ENCOUNTER — Encounter (HOSPITAL_COMMUNITY): Payer: Self-pay

## 2024-07-19 ENCOUNTER — Ambulatory Visit (HOSPITAL_COMMUNITY)
Admission: RE | Admit: 2024-07-19 | Discharge: 2024-07-19 | Disposition: A | Discharge status: Home / Self Care | Payer: Self-pay | Source: Ambulatory Visit | Attending: Family Medicine | Admitting: Family Medicine

## 2024-07-19 VITALS — BP 123/81 | HR 79 | Temp 98.2°F | Resp 18 | Ht 72.0 in | Wt 211.0 lb

## 2024-07-19 DIAGNOSIS — Z202 Contact with and (suspected) exposure to infections with a predominantly sexual mode of transmission: Secondary | ICD-10-CM | POA: Insufficient documentation

## 2024-07-19 DIAGNOSIS — Z113 Encounter for screening for infections with a predominantly sexual mode of transmission: Secondary | ICD-10-CM | POA: Insufficient documentation

## 2024-07-19 LAB — HIV ANTIBODY (ROUTINE TESTING W REFLEX): HIV Screen 4th Generation wRfx: NONREACTIVE

## 2024-07-19 MED ORDER — METRONIDAZOLE 500 MG PO TABS
2000.0000 mg | ORAL_TABLET | Freq: Once | ORAL | Status: AC
  Administered 2024-07-19: 2000 mg via ORAL

## 2024-07-19 MED ORDER — METRONIDAZOLE 500 MG PO TABS
ORAL_TABLET | ORAL | Status: AC
  Filled 2024-07-19: qty 4

## 2024-07-19 NOTE — Discharge Instructions (Signed)
 We have treated you for trichomoniasis this evening and  have sent testing for sexually transmitted infections. We will notify you of any positive results once they are received. If required, we will prescribe any medications you might need.  Please refrain from all sexual activity for at least the next seven days.

## 2024-07-19 NOTE — ED Triage Notes (Signed)
 Patient presenting for exposure to trich. Patient wanting get full STD panel testing including HIV and syphilis. Patient having slight stinging with urination and fatigue. Onset 4 days ago.

## 2024-07-20 ENCOUNTER — Ambulatory Visit: Payer: Self-pay

## 2024-07-20 LAB — CYTOLOGY, (ORAL, ANAL, URETHRAL) ANCILLARY ONLY
Chlamydia: NEGATIVE
Comment: NEGATIVE
Comment: NEGATIVE
Comment: NORMAL
Neisseria Gonorrhea: NEGATIVE
Trichomonas: POSITIVE — AB

## 2024-07-20 LAB — RPR: RPR Ser Ql: NONREACTIVE

## 2024-07-24 NOTE — ED Provider Notes (Signed)
 [facility]   [id number] 07/19/24 Arrival Time: 1832  ASSESSMENT & PLAN:  1. Screening for STDs (sexually transmitted diseases)   2. STD exposure    Meds ordered this encounter  Medications   metroNIDAZOLE  (FLAGYL ) tablet 2,000 mg  Treated for trich.     Discharge Instructions      We have treated you for trichomoniasis this evening and  have sent testing for sexually transmitted infections. We will notify you of any positive results once they are received. If required, we will prescribe any medications you might need.  Please refrain from all sexual activity for at least the next seven days.     Pending: Labs Reviewed  CYTOLOGY, (ORAL, ANAL, URETHRAL) ANCILLARY ONLY - Abnormal; Notable for the following components:      Result Value   Trichomonas Positive (*)    All other components within normal limits  RPR  HIV ANTIBODY (ROUTINE TESTING W REFLEX)    Will notify of any positive results. Instructed to refrain from sexual activity for at least seven days.  Reviewed expectations re: course of current medical issues. Questions answered. Outlined signs and symptoms indicating need for more acute intervention. Patient verbalized understanding. After Visit Summary given.   SUBJECTIVE:  [name] [name] is a 33 y.o. male who presents with complaint of exposure to trich. Patient wanting get full STD panel testing including HIV and syphilis. Patient having slight stinging with urination and fatigue. Onset 4 days ago.  .   OBJECTIVE:  Vitals:   07/19/24 1914 07/19/24 1915  BP:  123/81  Pulse:  79  Resp:  18  Temp:  98.2 F (36.8 C)  TempSrc:  Oral  SpO2:  94%  Weight: 95.7 kg   Height: 6' (1.829 m)      General appearance: alert, cooperative, appears stated age and no distress Throat: lips, mucosa, and tongue normal; teeth and gums normal Lungs: unlabored respirations; speaks full sentences without difficulty Back: no CVA tenderness; FROM at  waist Abdomen: soft, non-tender GU: deferred Skin: warm and dry Psychological: alert and cooperative; normal mood and affect.  Results for orders placed or performed during the hospital encounter of 07/19/24  RPR   Collection Time: 07/19/24  7:38 PM  Result Value Ref Range   RPR Ser Ql NON REACTIVE NON REACTIVE  HIV Antibody (routine testing w rflx)   Collection Time: 07/19/24  7:38 PM  Result Value Ref Range   HIV Screen 4th Generation wRfx Non Reactive Non Reactive  Cytology Ancillary Only -Urethral; GC / Chlamydia, Trichomonas   Collection Time: 07/19/24  7:40 PM  Result Value Ref Range   Neisseria Gonorrhea Negative    Chlamydia Negative    Trichomonas Positive (A)    Comment Normal Reference Ranger Chlamydia - Negative    Comment      Normal Reference Range Neisseria Gonorrhea - Negative   Comment Normal Reference Range Trichomonas - Negative     Labs Reviewed  CYTOLOGY, (ORAL, ANAL, URETHRAL) ANCILLARY ONLY - Abnormal; Notable for the following components:      Result Value   Trichomonas Positive (*)    All other components within normal limits  RPR  HIV ANTIBODY (ROUTINE TESTING W REFLEX)    No Known Allergies  Past Medical History:  Diagnosis Date   Diabetes (HCC)    Heart defect    Stroke [facility])    Family History  Problem Relation Age of Onset   Healthy Mother    Social History  Socioeconomic History   Marital status: Single    Spouse name: Not on file   Number of children: Not on file   Years of education: Not on file   Highest education level: Not on file  Occupational History   Not on file  Tobacco Use   Smoking status: Every Day    Current packs/day: 0.50    Types: Cigarettes   Smokeless tobacco: Never  Vaping Use   Vaping status: Never Used  Substance and Sexual Activity   Alcohol use: Yes    Alcohol/week: 10.0 standard drinks of alcohol    Types: 10 Standard drinks or equivalent per week   Drug use: Yes    Types: Marijuana   Sexual  activity: Yes    Birth control/protection: None  Other Topics Concern   Not on file  Social History Narrative   Not on file   Social Drivers of Health   Financial Resource Strain: Not on file  Food Insecurity: Not on file  Transportation Needs: Not on file  Physical Activity: Not on file  Stress: Not on file  Social Connections: Unknown (04/20/2022)   Received from [facility]   Social Network    Social Network: Not on file  Intimate Partner Violence: Unknown (03/12/2022)   Received from [facility]   HITS    Physically Hurt: Not on file    Insult or Talk Down To: Not on file    Threaten Physical Harm: Not on file    Scream or Curse: Not on file           Rolinda Rogue, MD 07/24/24 [phone]
